# Patient Record
Sex: Male | Born: 1990 | Race: Asian | Hispanic: No | Marital: Single | Smoking: Never smoker
Health system: Southern US, Community
[De-identification: ages and names within clinical notes are randomized; demographics above are authoritative.]

## PROBLEM LIST (undated history)

## (undated) DIAGNOSIS — Z22322 Carrier or suspected carrier of Methicillin resistant Staphylococcus aureus: Secondary | ICD-10-CM

## (undated) HISTORY — DX: Carrier or suspected carrier of methicillin resistant Staphylococcus aureus: Z22.322

---

## 2013-12-14 ENCOUNTER — Ambulatory Visit (INDEPENDENT_AMBULATORY_CARE_PROVIDER_SITE_OTHER): Payer: BC Managed Care – PPO | Admitting: Family Medicine

## 2013-12-14 ENCOUNTER — Ambulatory Visit: Payer: BC Managed Care – PPO

## 2013-12-14 VITALS — BP 138/90 | HR 76 | Temp 98.2°F | Resp 18 | Ht 67.5 in | Wt 158.0 lb

## 2013-12-14 DIAGNOSIS — J029 Acute pharyngitis, unspecified: Secondary | ICD-10-CM

## 2013-12-14 DIAGNOSIS — M25519 Pain in unspecified shoulder: Secondary | ICD-10-CM

## 2013-12-14 DIAGNOSIS — J309 Allergic rhinitis, unspecified: Secondary | ICD-10-CM

## 2013-12-14 DIAGNOSIS — R0981 Nasal congestion: Secondary | ICD-10-CM

## 2013-12-14 DIAGNOSIS — J3489 Other specified disorders of nose and nasal sinuses: Secondary | ICD-10-CM

## 2013-12-14 LAB — POCT RAPID STREP A (OFFICE): Rapid Strep A Screen: NEGATIVE

## 2013-12-14 NOTE — Progress Notes (Signed)
Chief Complaint:  Chief Complaint  Patient presents with  . Sinusitis  . Shoulder Pain    left    HPI: Jose Lyons is a 23 y.o. male who is here for 2 day history of sore throat and sinus congestion. He has nasal discharge. Minimal cough. Has some sinus pressure. No cough, no fevers or chills. Deneis allergies.  No history of sinus issues, except during the winter. No fever chills. SOme ear discomfort. Some  Facial pain and HA. He took some iburpfen. Has not tried any allergy meds except cough drop.  Has had 1 year history of  left shoulder pain, had been boxing and thought it might have been related to that, thought it was impingement, has been doing rotator cuff exercises, he also plays ultimate frisbee and it hurtspretty bad and currently does not know what to do next . He has stopped boxing. Some weakness and , feels like there is a clikc when he extends. He Pain with bench press and push up exercises. Has been mostly next to shoulder blade and also in the front at bicep shoulder girdle intersection. Sometimes rapid movement causes pain. But in the past 3-4 months he feel smore dull achey pain, more so after exercise.  Some pain at night. Does not have pain when he sleeps on it. He has tried ibuprofen and roller.  With minimal relief.   History reviewed. No pertinent past medical history. History reviewed. No pertinent past surgical history. History   Social History  . Marital Status: Single    Spouse Name: N/A    Number of Children: N/A  . Years of Education: N/A   Social History Main Topics  . Smoking status: Never Smoker   . Smokeless tobacco: None  . Alcohol Use: Yes  . Drug Use: No  . Sexual Activity: None   Other Topics Concern  . None   Social History Narrative  . None   History reviewed. No pertinent family history. No Known Allergies Prior to Admission medications   Not on File     ROS: The patient denies fevers, chills, night sweats, unintentional  weight loss, chest pain, palpitations, wheezing, dyspnea on exertion, nausea, vomiting, abdominal pain, dysuria, hematuria, melena  All other systems have been reviewed and were otherwise negative with the exception of those mentioned in the HPI and as above.    PHYSICAL EXAM: Filed Vitals:   12/14/13 1609  BP: 138/90  Pulse: 76  Temp: 98.2 F (36.8 C)  Resp: 18   Filed Vitals:   12/14/13 1609  Height: 5' 7.5" (1.715 m)  Weight: 158 lb (71.668 kg)   Body mass index is 24.37 kg/(m^2).  General: Alert, no acute distress HEENT:  Normocephalic, atraumatic, oropharynx patent. EOMI, PERRLA, TM nl, minimally tender sinus, bogg nares with erythemtous turbinates, no exudates.  Cardiovascular:  Regular rate and rhythm, no rubs murmurs or gallops.  No Carotid bruits, radial pulse intact. No pedal edema.  Respiratory: Clear to auscultation bilaterally.  No wheezes, rales, or rhonchi.  No cyanosis, no use of accessory musculature GI: No organomegaly, abdomen is soft and non-tender, positive bowel sounds.  No masses. Skin: No rashes. Neurologic: Facial musculature symmetric. Psychiatric: Patient is appropriate throughout our interaction. Lymphatic: No cervical lymphadenopathy Musculoskeletal: Gait intact. Neck exam-normal, neg spurling Left shoulder no deformities, no swelling, no erythema, full ROM, 5/5 strength, he has a click that is palpable upon hyperflexionof the shouldergirdle, it is felt at the bicepstendon/shoulder girdle jxn  senstation intact, 2/2 DTRs Neg empty can, neg liftoff, neg Hawkins Mild disomfort with internal rotation but has to be hyper IR  LABS: Results for orders placed in visit on 12/14/13  POCT RAPID STREP A (OFFICE)      Result Value Ref Range   Rapid Strep A Screen Negative  Negative     EKG/XRAY:   [facility] Neg for fx/dislocation   ASSESSMENT/PLAN: Encounter Diagnoses  Name Primary?  . Sore throat   . Pain in  joint, shoulder region Yes  . Sinus congestion   . Allergic rhinitis    Refer to ortho for shoulder pain since ongoing for years, possible rotator cuff , labral issues OTC nasacort, zyrtec IF sxs do not improve with otc meds then consider abx or other treatment options F/u prn  Gross sideeffects, risk and benefits, and alternatives of medications d/w patient. Patient is aware that all medications have potential sideeffects and we are unable to predict every sideeffect or drug-drug interaction that may occur.  Lenell Antuhao P Corbin Hott, DO 12/14/2013 6:22 PM

## 2013-12-14 NOTE — Patient Instructions (Signed)
TAKE OTC NASOCORT nasal spray daily TAKE Zyrtec DAILY IF NO IMPROVEMENT THEN CALL ME IN 7 DAYS

## 2014-02-22 ENCOUNTER — Ambulatory Visit (INDEPENDENT_AMBULATORY_CARE_PROVIDER_SITE_OTHER): Payer: BC Managed Care – PPO | Admitting: Family Medicine

## 2014-02-22 VITALS — BP 130/70 | HR 60 | Temp 97.5°F | Resp 20 | Ht 67.0 in | Wt 156.6 lb

## 2014-02-22 DIAGNOSIS — Z113 Encounter for screening for infections with a predominantly sexual mode of transmission: Secondary | ICD-10-CM

## 2014-02-22 NOTE — Progress Notes (Signed)
   Subjective:    Patient ID: Jose RetortMichael Lyons, male    DOB: [birth year], 23 y.o.   MRN: [id number]  HPI Patient presents for STD testing. No current symptoms of STD such as penile discharge, itching, burning. Sexually active with one male partners, has had 3 partners in the last year. No condom use. No history of STDs. No concern that partner has STD. No fevers, chills, muscle aches.  Review of Systems  All other systems reviewed and are negative.     Objective:   Physical Exam  Constitutional: He is oriented to person, place, and time. He appears well-developed and well-nourished. No distress.  HENT:  Head: Normocephalic and atraumatic.  Eyes: Conjunctivae are normal. No scleral icterus.  Cardiovascular: Normal rate.   Pulmonary/Chest: Effort normal.  Musculoskeletal: Normal range of motion.  Neurological: He is alert and oriented to person, place, and time.  Skin: Skin is warm and dry.  Psychiatric: He has a normal mood and affect. His behavior is normal.      Assessment & Plan:  #1. STD screening - GC/CT, HIV, RPR - Counsel condom use for STD prevention - F/u prn

## 2014-02-22 NOTE — Patient Instructions (Signed)
Thank you for coming in today  1. We will call you with your lab results next week 2. Use condoms to prevent STD transmission 3. I recommend annual STD checks or more frequently with new partners  Followup as needed

## 2014-02-23 LAB — HIV ANTIBODY (ROUTINE TESTING W REFLEX): HIV 1&2 Ab, 4th Generation: NONREACTIVE

## 2014-02-23 LAB — RPR

## 2014-02-24 LAB — GC/CHLAMYDIA PROBE AMP
CT PROBE, AMP APTIMA: NEGATIVE
GC PROBE AMP APTIMA: NEGATIVE

## 2014-02-28 ENCOUNTER — Encounter: Payer: Self-pay | Admitting: *Deleted

## 2014-07-26 ENCOUNTER — Other Ambulatory Visit (HOSPITAL_COMMUNITY): Payer: Self-pay | Admitting: Family Medicine

## 2014-07-26 ENCOUNTER — Ambulatory Visit (HOSPITAL_COMMUNITY): Payer: BC Managed Care – PPO | Attending: Family Medicine

## 2014-07-26 DIAGNOSIS — R011 Cardiac murmur, unspecified: Secondary | ICD-10-CM

## 2014-07-26 NOTE — Progress Notes (Signed)
2D Echo completed. 07/26/2014 

## 2015-02-12 ENCOUNTER — Ambulatory Visit (INDEPENDENT_AMBULATORY_CARE_PROVIDER_SITE_OTHER): Payer: 59 | Admitting: Internal Medicine

## 2015-02-12 VITALS — BP 126/70 | HR 90 | Temp 97.9°F | Resp 16 | Ht 67.0 in | Wt 159.0 lb

## 2015-02-12 DIAGNOSIS — L03113 Cellulitis of right upper limb: Secondary | ICD-10-CM | POA: Diagnosis not present

## 2015-02-12 DIAGNOSIS — S40811A Abrasion of right upper arm, initial encounter: Secondary | ICD-10-CM | POA: Diagnosis not present

## 2015-02-12 MED ORDER — DOXYCYCLINE HYCLATE 100 MG PO TABS: 100.0000 mg | ORAL_TABLET | Freq: Two times a day (BID) | ORAL | Status: DC

## 2015-02-12 MED ORDER — MUPIROCIN 2 % EX OINT: 1.0000 "application " | TOPICAL_OINTMENT | Freq: Three times a day (TID) | CUTANEOUS | Status: DC

## 2015-02-12 NOTE — Patient Instructions (Signed)

## 2015-02-12 NOTE — Progress Notes (Signed)
   Subjective:    Patient ID: Jose RetortMichael Lyons, male    DOB: February 24, 1991, 24 y.o.   MRN: [id number]  HPI I, Trenda MootsMichelle Farrington R.T. (R), am scribing for Dr. Robert Bellowhris Elbridge Magowan M.D.   Jose NeedleMichael comes in today with right medial elbow pain. He states he was playing Ultimate Frisbee and "took a dive" x 3 days ago. He states it has gotten red and inflamed. He also states the wound has been draining and he has been using bacitracin.  Has healing abrasion right hip, has painful swollen oozing abrasion right forearm. States reddness is swReview of Systems  Constitutional: Negative.   Respiratory: Negative.   Musculoskeletal: Negative.   Skin: Positive for wound.        Objective:   Physical Exam  Constitutional: He appears well-developed and well-nourished.  Eyes: EOM are normal.  Pulmonary/Chest: Effort normal.  Skin: Abrasion noted. There is erythema.     Psychiatric: He has a normal mood and affect.          Assessment & Plan:  Cellulitis/abrasion Doxycycline/Bactroban

## 2015-02-26 ENCOUNTER — Encounter: Payer: Self-pay | Admitting: Nurse Practitioner

## 2015-02-26 ENCOUNTER — Ambulatory Visit (INDEPENDENT_AMBULATORY_CARE_PROVIDER_SITE_OTHER): Payer: 59 | Admitting: Nurse Practitioner

## 2015-02-26 VITALS — BP 128/88 | HR 64 | Temp 97.6°F | Resp 18 | Ht 68.5 in | Wt 155.8 lb

## 2015-02-26 DIAGNOSIS — Z23 Encounter for immunization: Secondary | ICD-10-CM | POA: Diagnosis not present

## 2015-02-26 DIAGNOSIS — Z0000 Encounter for general adult medical examination without abnormal findings: Secondary | ICD-10-CM

## 2015-02-26 DIAGNOSIS — J302 Other seasonal allergic rhinitis: Secondary | ICD-10-CM

## 2015-02-26 DIAGNOSIS — J301 Allergic rhinitis due to pollen: Secondary | ICD-10-CM | POA: Insufficient documentation

## 2015-02-26 MED ORDER — TETANUS-DIPHTH-ACELL PERTUSSIS 5-2.5-18.5 LF-MCG/0.5 IM SUSP: 0.5000 mL | Freq: Once | INTRAMUSCULAR | Status: DC

## 2015-02-26 NOTE — Patient Instructions (Signed)
Preventive Care for Adults A healthy lifestyle and preventive care can promote health and wellness. Preventive health guidelines for men include the following key practices:  A routine yearly physical is a good way to check with your health care provider about your health and preventative screening. It is a chance to share any concerns and updates on your health and to receive a thorough exam.  Visit your dentist for a routine exam and preventative care every 6 months. Brush your teeth twice a day and floss once a day. Good oral hygiene prevents tooth decay and gum disease.  The frequency of eye exams is based on your age, health, family medical history, use of contact lenses, and other factors. Follow your health care provider's recommendations for frequency of eye exams.  Eat a healthy diet. Foods such as vegetables, fruits, whole grains, low-fat dairy products, and lean protein foods contain the nutrients you need without too many calories. Decrease your intake of foods high in solid fats, added sugars, and salt. Eat the right amount of calories for you.Get information about a proper diet from your health care provider, if necessary.  Regular physical exercise is one of the most important things you can do for your health. Most adults should get at least 150 minutes of moderate-intensity exercise (any activity that increases your heart rate and causes you to sweat) each week. In addition, most adults need muscle-strengthening exercises on 2 or more days a week.  Maintain a healthy weight. The body mass index (BMI) is a screening tool to identify possible weight problems. It provides an estimate of body fat based on height and weight. Your health care provider can find your BMI and can help you achieve or maintain a healthy weight.For adults 20 years and older:  A BMI below 18.5 is considered underweight.  A BMI of 18.5 to 24.9 is normal.  A BMI of 25 to 29.9 is considered  overweight.  A BMI of 30 and above is considered obese.  Maintain normal blood lipids and cholesterol levels by exercising and minimizing your intake of saturated fat. Eat a balanced diet with plenty of fruit and vegetables. Blood tests for lipids and cholesterol should begin at age 24 and be repeated every 5 years. If your lipid or cholesterol levels are high, you are over 50, or you are at high risk for heart disease, you may need your cholesterol levels checked more frequently.Ongoing high lipid and cholesterol levels should be treated with medicines if diet and exercise are not working.  If you smoke, find out from your health care provider how to quit. If you do not use tobacco, do not start.  Lung cancer screening is recommended for adults aged 30-80 years who are at high risk for developing lung cancer because of a history of smoking. A yearly low-dose CT scan of the lungs is recommended for people who have at least a 30-pack-year history of smoking and are a current smoker or have quit within the past 15 years. A pack year of smoking is smoking an average of 1 pack of cigarettes a day for 1 year (for example: 1 pack a day for 30 years or 2 packs a day for 15 years). Yearly screening should continue until the smoker has stopped smoking for at least 15 years. Yearly screening should be stopped for people who develop a health problem that would prevent them from having lung cancer treatment.  If you choose to drink alcohol, do not  2 drinks per day. One drink is considered to be 12 ounces (355 mL) of beer, 5 ounces (148 mL) of wine, or 1.5 ounces (44 mL) of liquor.  Avoid use of street drugs. Do not share needles with anyone. Ask for help if you need support or instructions about stopping the use of drugs.  High blood pressure causes heart disease and increases the risk of stroke. Your blood pressure should be checked at least every 1-2 years. Ongoing high blood pressure should be treated with  medicines, if weight loss and exercise are not effective.  If you are 45-79 years old, ask your health care provider if you should take aspirin to prevent heart disease.  Diabetes screening involves taking a blood sample to check your fasting blood sugar level. This should be done once every 3 years, after age 45, if you are within normal weight and without risk factors for diabetes. Testing should be considered at a younger age or be carried out more frequently if you are overweight and have at least 1 risk factor for diabetes.  Colorectal cancer can be detected and often prevented. Most routine colorectal cancer screening begins at the age of 50 and continues through age 75. However, your health care provider may recommend screening at an earlier age if you have risk factors for colon cancer. On a yearly basis, your health care provider may provide home test kits to check for hidden blood in the stool. Use of a small camera at the end of a tube to directly examine the colon (sigmoidoscopy or colonoscopy) can detect the earliest forms of colorectal cancer. Talk to your health care provider about this at age 50, when routine screening begins. Direct exam of the colon should be repeated every 5-10 years through age 75, unless early forms of precancerous polyps or small growths are found.  People who are at an increased risk for hepatitis B should be screened for this virus. You are considered at high risk for hepatitis B if:  You were born in a country where hepatitis B occurs often. Talk with your health care provider about which countries are considered high risk.  Your parents were born in a high-risk country and you have not received a shot to protect against hepatitis B (hepatitis B vaccine).  You have HIV or AIDS.  You use needles to inject street drugs.  You live with, or have sex with, someone who has hepatitis B.  You are a man who has sex with other men (MSM).  You get hemodialysis  treatment.  You take certain medicines for conditions such as cancer, organ transplantation, and autoimmune conditions.  Hepatitis C blood testing is recommended for all people born from 1945 through 1965 and any individual with known risks for hepatitis C.  Practice safe sex. Use condoms and avoid high-risk sexual practices to reduce the spread of sexually transmitted infections (STIs). STIs include gonorrhea, chlamydia, syphilis, trichomonas, herpes, HPV, and human immunodeficiency virus (HIV). Herpes, HIV, and HPV are viral illnesses that have no cure. They can result in disability, cancer, and death.  If you are at risk of being infected with HIV, it is recommended that you take a prescription medicine daily to prevent HIV infection. This is called preexposure prophylaxis (PrEP). You are considered at risk if:  You are a man who has sex with other men (MSM) and have other risk factors.  You are a heterosexual man, are sexually active, and are at increased risk for HIV infection.    You take drugs by injection.  You are sexually active with a partner who has HIV.  Talk with your health care provider about whether you are at high risk of being infected with HIV. If you choose to begin PrEP, you should first be tested for HIV. You should then be tested every 3 months for as long as you are taking PrEP.  A one-time screening for abdominal aortic aneurysm (AAA) and surgical repair of large AAAs by ultrasound are recommended for men ages 65 to 75 years who are current or former smokers.  Healthy men should no longer receive prostate-specific antigen (PSA) blood tests as part of routine cancer screening. Talk with your health care provider about prostate cancer screening.  Testicular cancer screening is not recommended for adult males who have no symptoms. Screening includes self-exam, a health care provider exam, and other screening tests. Consult with your health care provider about any symptoms  you have or any concerns you have about testicular cancer.  Use sunscreen. Apply sunscreen liberally and repeatedly throughout the day. You should seek shade when your shadow is shorter than you. Protect yourself by wearing long sleeves, pants, a wide-brimmed hat, and sunglasses year round, whenever you are outdoors.  Once a month, do a whole-body skin exam, using a mirror to look at the skin on your back. Tell your health care provider about new moles, moles that have irregular borders, moles that are larger than a pencil eraser, or moles that have changed in shape or color.  Stay current with required vaccines (immunizations).  Influenza vaccine. All adults should be immunized every year.  Tetanus, diphtheria, and acellular pertussis (Td, Tdap) vaccine. An adult who has not previously received Tdap or who does not know his vaccine status should receive 1 dose of Tdap. This initial dose should be followed by tetanus and diphtheria toxoids (Td) booster doses every 10 years. Adults with an unknown or incomplete history of completing a 3-dose immunization series with Td-containing vaccines should begin or complete a primary immunization series including a Tdap dose. Adults should receive a Td booster every 10 years.  Varicella vaccine. An adult without evidence of immunity to varicella should receive 2 doses or a second dose if he has previously received 1 dose.  Human papillomavirus (HPV) vaccine. Males aged 13-21 years who have not received the vaccine previously should receive the 3-dose series. Males aged 22-26 years may be immunized. Immunization is recommended through the age of 26 years for any male who has sex with males and did not get any or all doses earlier. Immunization is recommended for any person with an immunocompromised condition through the age of 26 years if he did not get any or all doses earlier. During the 3-dose series, the second dose should be obtained 4-8 weeks after the first  dose. The third dose should be obtained 24 weeks after the first dose and 16 weeks after the second dose.  Zoster vaccine. One dose is recommended for adults aged 60 years or older unless certain conditions are present.  Measles, mumps, and rubella (MMR) vaccine. Adults born before 1957 generally are considered immune to measles and mumps. Adults born in 1957 or later should have 1 or more doses of MMR vaccine unless there is a contraindication to the vaccine or there is laboratory evidence of immunity to each of the three diseases. A routine second dose of MMR vaccine should be obtained at least 28 days after the first dose for students attending postsecondary   for students attending postsecondary schools, health care workers, or international travelers. People who received inactivated measles vaccine or an unknown type of measles vaccine during 1963-1967 should receive 2 doses of MMR vaccine. People who received inactivated mumps vaccine or an unknown type of mumps vaccine before 1979 and are at high risk for mumps infection should consider immunization with 2 doses of MMR vaccine. Unvaccinated health care workers born before [birth year] who lack laboratory evidence of measles, mumps, or rubella immunity or laboratory confirmation of disease should consider measles and mumps immunization with 2 doses of MMR vaccine or rubella immunization with 1 dose of MMR vaccine.  Pneumococcal 13-valent conjugate (PCV13) vaccine. When indicated, a person who is uncertain of his immunization history and has no record of immunization should receive the PCV13 vaccine. An adult aged 58 years or older who has certain medical conditions and has not been previously immunized should receive 1 dose of PCV13 vaccine. This PCV13 should be followed with a dose of pneumococcal polysaccharide (PPSV23) vaccine. The PPSV23 vaccine dose should be obtained at least 8 weeks after the dose of PCV13 vaccine. An adult aged 50 years or older who has certain medical  conditions and previously received 1 or more doses of PPSV23 vaccine should receive 1 dose of PCV13. The PCV13 vaccine dose should be obtained 1 or more years after the last PPSV23 vaccine dose.  Pneumococcal polysaccharide (PPSV23) vaccine. When PCV13 is also indicated, PCV13 should be obtained first. All adults aged 69 years and older should be immunized. An adult younger than age 80 years who has certain medical conditions should be immunized. Any person who resides in a nursing home or long-term care facility should be immunized. An adult smoker should be immunized. People with an immunocompromised condition and certain other conditions should receive both PCV13 and PPSV23 vaccines. People with human immunodeficiency virus (HIV) infection should be immunized as soon as possible after diagnosis. Immunization during chemotherapy or radiation therapy should be avoided. Routine use of PPSV23 vaccine is not recommended for American Indians, [place] Natives, or people younger than 65 years unless there are medical conditions that require PPSV23 vaccine. When indicated, people who have unknown immunization and have no record of immunization should receive PPSV23 vaccine. One-time revaccination 5 years after the first dose of PPSV23 is recommended for people aged 19-64 years who have chronic kidney failure, nephrotic syndrome, asplenia, or immunocompromised conditions. People who received 1-2 doses of PPSV23 before age 61 years should receive another dose of PPSV23 vaccine at age 30 years or later if at least 5 years have passed since the previous dose. Doses of PPSV23 are not needed for people immunized with PPSV23 at or after age 24 years.  Meningococcal vaccine. Adults with asplenia or persistent complement component deficiencies should receive 2 doses of quadrivalent meningococcal conjugate (MenACWY-D) vaccine. The doses should be obtained at least 2 months apart. Microbiologists working with certain  meningococcal bacteria, [employer] recruits, people at risk during an outbreak, and people who travel to or live in countries with a high rate of meningitis should be immunized. A first-year college student up through age 92 years who is living in a residence hall should receive a dose if he did not receive a dose on or after his 16th birthday. Adults who have certain high-risk conditions should receive one or more doses of vaccine.  Hepatitis A vaccine. Adults who wish to be protected from this disease, have certain high-risk conditions, work with hepatitis A-infected animals, work in hepatitis  A research labs, or travel to or work in countries with a high rate of hepatitis A should be immunized. Adults who were previously unvaccinated and who anticipate close contact with an international adoptee during the first 60 days after arrival in the [place] from a country with a high rate of hepatitis A should be immunized.  Hepatitis B vaccine. Adults should be immunized if they wish to be protected from this disease, have certain high-risk conditions, may be exposed to blood or other infectious body fluids, are household contacts or sex partners of hepatitis B positive people, are clients or workers in certain care facilities, or travel to or work in countries with a high rate of hepatitis B.  Haemophilus influenzae type b (Hib) vaccine. A previously unvaccinated person with asplenia or sickle cell disease or having a scheduled splenectomy should receive 1 dose of Hib vaccine. Regardless of previous immunization, a recipient of a hematopoietic stem cell transplant should receive a 3-dose series 6-12 months after his successful transplant. Hib vaccine is not recommended for adults with HIV infection. Preventive Service / Frequency Ages 44 to 105  Blood pressure check.** / Every 1 to 2 years.  Lipid and cholesterol check.** / Every 5 years beginning at age 84.  Hepatitis C blood test.** / For any  individual with known risks for hepatitis C.  Skin self-exam. / Monthly.  Influenza vaccine. / Every year.  Tetanus, diphtheria, and acellular pertussis (Tdap, Td) vaccine.** / Consult your health care provider. 1 dose of Td every 10 years.  Varicella vaccine.** / Consult your health care provider.  HPV vaccine. / 3 doses over 6 months, if 71 or younger.  Measles, mumps, rubella (MMR) vaccine.** / You need at least 1 dose of MMR if you were born in 1957 or later. You may also need a second dose.  Pneumococcal 13-valent conjugate (PCV13) vaccine.** / Consult your health care provider.  Pneumococcal polysaccharide (PPSV23) vaccine.** / 1 to 2 doses if you smoke cigarettes or if you have certain conditions.  Meningococcal vaccine.** / 1 dose if you are age 59 to 45 years and a Market researcher living in a residence hall, or have one of several medical conditions. You may also need additional booster doses.  Hepatitis A vaccine.** / Consult your health care provider.  Hepatitis B vaccine.** / Consult your health care provider.  Haemophilus influenzae type b (Hib) vaccine.** / Consult your health care provider. Ages 27 to 19  Blood pressure check.** / Every 1 to 2 years.  Lipid and cholesterol check.** / Every 5 years beginning at age 25.  Lung cancer screening. / Every year if you are aged 52-80 years and have a 30-pack-year history of smoking and currently smoke or have quit within the past 15 years. Yearly screening is stopped once you have quit smoking for at least 15 years or develop a health problem that would prevent you from having lung cancer treatment.  Fecal occult blood test (FOBT) of stool. / Every year beginning at age 58 and continuing until age 67. You may not have to do this test if you get a colonoscopy every 10 years.  Flexible sigmoidoscopy** or colonoscopy.** / Every 5 years for a flexible sigmoidoscopy or every 10 years for a colonoscopy beginning at age  103 and continuing until age 87.  Hepatitis C blood test.** / For all people born from [birth year] through 1965 and any individual with known risks for hepatitis C.  Skin self-exam. / Monthly.  Influenza vaccine. / Every year.  Tetanus, diphtheria, and acellular pertussis (Tdap/Td) vaccine.** / Consult your health care provider. 1 dose of Td every 10 years.  Varicella vaccine.** / Consult your health care provider.  Zoster vaccine.** / 1 dose for adults aged 71 years or older.  Measles, mumps, rubella (MMR) vaccine.** / You need at least 1 dose of MMR if you were born in 1957 or later. You may also need a second dose.  Pneumococcal 13-valent conjugate (PCV13) vaccine.** / Consult your health care provider.  Pneumococcal polysaccharide (PPSV23) vaccine.** / 1 to 2 doses if you smoke cigarettes or if you have certain conditions.  Meningococcal vaccine.** / Consult your health care provider.  Hepatitis A vaccine.** / Consult your health care provider.  Hepatitis B vaccine.** / Consult your health care provider.  Haemophilus influenzae type b (Hib) vaccine.** / Consult your health care provider. Ages 86 and over  Blood pressure check.** / Every 1 to 2 years.  Lipid and cholesterol check.**/ Every 5 years beginning at age 76.  Lung cancer screening. / Every year if you are aged 60-80 years and have a 30-pack-year history of smoking and currently smoke or have quit within the past 15 years. Yearly screening is stopped once you have quit smoking for at least 15 years or develop a health problem that would prevent you from having lung cancer treatment.  Fecal occult blood test (FOBT) of stool. / Every year beginning at age 109 and continuing until age 37. You may not have to do this test if you get a colonoscopy every 10 years.  Flexible sigmoidoscopy** or colonoscopy.** / Every 5 years for a flexible sigmoidoscopy or every 10 years for a colonoscopy beginning at age 70 and continuing until age  48.  Hepatitis C blood test.** / For all people born from [birth year] through 1965 and any individual with known risks for hepatitis C.  Abdominal aortic aneurysm (AAA) screening.** / A one-time screening for ages 89 to 41 years who are current or former smokers.  Skin self-exam. / Monthly.  Influenza vaccine. / Every year.  Tetanus, diphtheria, and acellular pertussis (Tdap/Td) vaccine.** / 1 dose of Td every 10 years.  Varicella vaccine.** / Consult your health care provider.  Zoster vaccine.** / 1 dose for adults aged 90 years or older.  Pneumococcal 13-valent conjugate (PCV13) vaccine.** / Consult your health care provider.  Pneumococcal polysaccharide (PPSV23) vaccine.** / 1 dose for all adults aged 53 years and older.  Meningococcal vaccine.** / Consult your health care provider.  Hepatitis A vaccine.** / Consult your health care provider.  Hepatitis B vaccine.** / Consult your health care provider.  Haemophilus influenzae type b (Hib) vaccine.** / Consult your health care provider. **Family history and personal history of risk and conditions may change your health care provider's recommendations. Document Released: 10/20/2001 Document Revised: 08/29/2013 Document Reviewed: 01/19/2011 [facility] Patient Information 2015 [place], [place]. This information is not intended to replace advice given to you by your health care provider. Make sure you discuss any questions you have with your health care provider.

## 2015-02-26 NOTE — Progress Notes (Signed)
Patient ID: Jose Lyons, male   DOB: [birth year], 24 y.o.   MRN: [id number]    PCP: No PCP Per Patient  No Known Allergies  Chief Complaint  Patient presents with  . Establish Care    Establishe care     HPI: Patient is a 24 y.o. male seen in the office today to establish care. No complaints today. Changed insurance and needed a new primary care.  Hx of seasonal allergies, shoulder pain Saw orthopedist- unsure of name- no intervention done.   Vaccines Up to date on:  influenza,  Need: Tdap, Rx given  Smoking status: none Alcohol use: none  Dentist: has not had routine cleaning but has appt set up Ophthalmologist: does not go, no changes in vision  Exercise regimen: 5 days a week, 1 hour Lifts weights Diet: tries to eat enough carbs, protein and vegetables   Had cholesterol checked last year and it was fine  Review of Systems:  Review of Systems  Constitutional: Negative for activity change, appetite change, fatigue and unexpected weight change.  HENT: Negative for congestion and hearing loss.   Eyes: Negative.   Respiratory: Negative for cough and shortness of breath.   Cardiovascular: Negative for chest pain, palpitations and leg swelling.  Gastrointestinal: Negative for abdominal pain, diarrhea and constipation.  Genitourinary: Negative for dysuria and difficulty urinating.  Musculoskeletal: Negative for myalgias and arthralgias.       Hx of shoulder pain, left  Skin: Negative for color change and wound.       Hx of MRSA  Allergic/Immunologic: Positive for environmental allergies.  Neurological: Negative for dizziness, weakness, light-headedness and headaches.  Psychiatric/Behavioral: Negative for behavioral problems, confusion and agitation.    Past Medical History  Diagnosis Date  . MRSA (methicillin resistant staph aureus) culture positive    History reviewed. No pertinent past surgical history. Social History:   reports that he has never smoked. He has  never used smokeless tobacco. He reports that he does not drink alcohol or use illicit drugs.  Family History  Problem Relation Age of Onset  . Diabetes Paternal Grandmother     Medications: Patient's Medications  New Prescriptions   No medications on file  Previous Medications   CETIRIZINE (ZYRTEC) 10 MG TABLET    Take 10 mg by mouth daily.   OMEGA-3 FATTY ACIDS (FISH OIL) 1200 MG CAPS    Take by mouth. Take 1 tablet by mouth daily  Modified Medications   No medications on file  Discontinued Medications   DOXYCYCLINE (VIBRA-TABS) 100 MG TABLET    Take 1 tablet (100 mg total) by mouth 2 (two) times daily.   MUPIROCIN OINTMENT (BACTROBAN) 2 %    Apply 1 application topically 3 (three) times daily.     Physical Exam:  Filed Vitals:   02/26/15 0838  BP: 128/88  Pulse: 64  Temp: 97.6 F (36.4 C)  TempSrc: Oral  Resp: 18  Height: 5' 8.5" (1.74 m)  Weight: 155 lb 12.8 oz (70.67 kg)  SpO2: 97%    Physical Exam  Constitutional: He is oriented to person, place, and time. He appears well-developed and well-nourished. No distress.  HENT:  Head: Normocephalic and atraumatic.  Mouth/Throat: Oropharynx is clear and moist. No oropharyngeal exudate.  Eyes: Conjunctivae and EOM are normal. Pupils are equal, round, and reactive to light.  Neck: Normal range of motion. Neck supple.  Cardiovascular: Normal rate, regular rhythm and normal heart sounds.   Pulmonary/Chest: Effort normal and breath sounds normal.  Abdominal: Soft. Bowel sounds are normal. He exhibits no distension.  Musculoskeletal: Normal range of motion. He exhibits no edema or tenderness.  Neurological: He is alert and oriented to person, place, and time.  Skin: Skin is warm and dry. He is not diaphoretic.  Psychiatric: He has a normal mood and affect.    Labs reviewed: Basic Metabolic Panel: No results for input(s): NA, K, CL, CO2, GLUCOSE, BUN, CREATININE, CALCIUM, MG, PHOS, TSH in the last 8760 hours. Liver  Function Tests: No results for input(s): AST, ALT, ALKPHOS, BILITOT, PROT, ALBUMIN in the last 8760 hours. No results for input(s): LIPASE, AMYLASE in the last 8760 hours. No results for input(s): AMMONIA in the last 8760 hours. CBC: No results for input(s): WBC, NEUTROABS, HGB, HCT, MCV, PLT in the last 8760 hours. Lipid Panel: No results for input(s): CHOL, HDL, LDLCALC, TRIG, CHOLHDL, LDLDIRECT in the last 8760 hours. TSH: No results for input(s): TSH in the last 8760 hours. A1C: No results found for: HGBA1C   Assessment/Plan 1. Seasonal allergies -currently on zyrtec 10 mg which has not been as effective recently, plans to change to Claritin 10 mg to see if this is more effective   2. Preventative health care The patient is doing well and no distinct problems were identified on exam. The patient was counseled regarding the appropriate use of alcohol, regular self-examination of the breasts on a monthly basis, prevention of dental and periodontal disease, diet, regular sustained exercise for at least 30 minutes 5 times per week, testicular self-examination on a monthly basis,smoking cessation, tobacco use,  and recommended schedule for cholesterol, thyroid and diabetes screening. - Lipid panel; Future - Comprehensive metabolic panel; Future - CBC With Differential; Future   Davonne Baby K. Biagio Borg  [facility] [phone] 8 am - 5 pm) [phone] (after hours)

## 2016-02-24 ENCOUNTER — Other Ambulatory Visit: Payer: 59

## 2016-02-24 DIAGNOSIS — Z0000 Encounter for general adult medical examination without abnormal findings: Secondary | ICD-10-CM

## 2016-02-25 LAB — CBC WITH DIFFERENTIAL/PLATELET
BASOS ABS: 0 10*3/uL (ref 0.0–0.2)
BASOS: 0 %
EOS (ABSOLUTE): 0.6 10*3/uL — AB (ref 0.0–0.4)
Eos: 12 %
HEMOGLOBIN: 15 g/dL (ref 12.6–17.7)
Hematocrit: 45.1 % (ref 37.5–51.0)
Immature Grans (Abs): 0.1 10*3/uL (ref 0.0–0.1)
Immature Granulocytes: 1 %
LYMPHS ABS: 1.4 10*3/uL (ref 0.7–3.1)
Lymphs: 31 %
MCH: 29.3 pg (ref 26.6–33.0)
MCHC: 33.3 g/dL (ref 31.5–35.7)
MCV: 88 fL (ref 79–97)
Monocytes Absolute: 0.3 10*3/uL (ref 0.1–0.9)
Monocytes: 7 %
NEUTROS ABS: 2.3 10*3/uL (ref 1.4–7.0)
Neutrophils: 49 %
PLATELETS: 211 10*3/uL (ref 150–379)
RBC: 5.12 x10E6/uL (ref 4.14–5.80)
RDW: 13.1 % (ref 12.3–15.4)
WBC: 4.6 10*3/uL (ref 3.4–10.8)

## 2016-02-25 LAB — LIPID PANEL
CHOL/HDL RATIO: 3 ratio (ref 0.0–5.0)
Cholesterol, Total: 167 mg/dL (ref 100–199)
HDL: 55 mg/dL (ref >39)
LDL CALC: 88 mg/dL (ref 0–99)
TRIGLYCERIDES: 119 mg/dL (ref 0–149)
VLDL Cholesterol Cal: 24 mg/dL (ref 5–40)

## 2016-02-25 LAB — COMPREHENSIVE METABOLIC PANEL
ALT: 36 IU/L (ref 0–44)
AST: 24 IU/L (ref 0–40)
Albumin/Globulin Ratio: 1.7 (ref 1.2–2.2)
Albumin: 4.7 g/dL (ref 3.5–5.5)
Alkaline Phosphatase: 69 IU/L (ref 39–117)
BILIRUBIN TOTAL: 0.4 mg/dL (ref 0.0–1.2)
BUN/Creatinine Ratio: 12 (ref 9–20)
BUN: 12 mg/dL (ref 6–20)
CHLORIDE: 100 mmol/L (ref 96–106)
CO2: 26 mmol/L (ref 18–29)
Calcium: 9.5 mg/dL (ref 8.7–10.2)
Creatinine, Ser: 1.04 mg/dL (ref 0.76–1.27)
GFR calc non Af Amer: 99 mL/min/{1.73_m2} (ref >59)
GFR, EST AFRICAN AMERICAN: 114 mL/min/{1.73_m2} (ref >59)
GLOBULIN, TOTAL: 2.7 g/dL (ref 1.5–4.5)
Glucose: 94 mg/dL (ref 65–99)
POTASSIUM: 4.2 mmol/L (ref 3.5–5.2)
SODIUM: 138 mmol/L (ref 134–144)
Total Protein: 7.4 g/dL (ref 6.0–8.5)

## 2016-02-27 ENCOUNTER — Encounter: Payer: Self-pay | Admitting: Nurse Practitioner

## 2016-02-27 ENCOUNTER — Ambulatory Visit: Payer: 59 | Admitting: Nurse Practitioner

## 2016-03-12 ENCOUNTER — Encounter: Payer: Self-pay | Admitting: Nurse Practitioner

## 2016-03-12 ENCOUNTER — Ambulatory Visit (INDEPENDENT_AMBULATORY_CARE_PROVIDER_SITE_OTHER): Payer: 59 | Admitting: Nurse Practitioner

## 2016-03-12 VITALS — BP 128/82 | HR 64 | Temp 97.6°F | Resp 17 | Ht 69.0 in | Wt 156.6 lb

## 2016-03-12 DIAGNOSIS — Z23 Encounter for immunization: Secondary | ICD-10-CM

## 2016-03-12 DIAGNOSIS — Z0000 Encounter for general adult medical examination without abnormal findings: Secondary | ICD-10-CM

## 2016-03-12 NOTE — Patient Instructions (Signed)
Follow up in 1 year with Dr Eulas Post for Physical   Preventive Care for Adults, Male A healthy lifestyle and preventive care can promote health and wellness. Preventive health guidelines for men include the following key practices:  A routine yearly physical is a good way to check with your health care provider about your health and preventative screening. It is a chance to share any concerns and updates on your health and to receive a thorough exam.  Visit your dentist for a routine exam and preventative care every 6 months. Brush your teeth twice a day and floss once a day. Good oral hygiene prevents tooth decay and gum disease.  The frequency of eye exams is based on your age, health, family medical history, use of contact lenses, and other factors. Follow your health care provider's recommendations for frequency of eye exams.  Eat a healthy diet. Foods such as vegetables, fruits, whole grains, low-fat dairy products, and lean protein foods contain the nutrients you need without too many calories. Decrease your intake of foods high in solid fats, added sugars, and salt. Eat the right amount of calories for you.Get information about a proper diet from your health care provider, if necessary.  Regular physical exercise is one of the most important things you can do for your health. Most adults should get at least 150 minutes of moderate-intensity exercise (any activity that increases your heart rate and causes you to sweat) each week. In addition, most adults need muscle-strengthening exercises on 2 or more days a week.  Maintain a healthy weight. The body mass index (BMI) is a screening tool to identify possible weight problems. It provides an estimate of body fat based on height and weight. Your health care provider can find your BMI and can help you achieve or maintain a healthy weight.For adults 20 years and older:  A BMI below 18.5 is considered underweight.  A BMI of 18.5 to 24.9 is  normal.  A BMI of 25 to 29.9 is considered overweight.  A BMI of 30 and above is considered obese.  Maintain normal blood lipids and cholesterol levels by exercising and minimizing your intake of saturated fat. Eat a balanced diet with plenty of fruit and vegetables. Blood tests for lipids and cholesterol should begin at age 13 and be repeated every 5 years. If your lipid or cholesterol levels are high, you are over 50, or you are at high risk for heart disease, you may need your cholesterol levels checked more frequently.Ongoing high lipid and cholesterol levels should be treated with medicines if diet and exercise are not working.  If you smoke, find out from your health care provider how to quit. If you do not use tobacco, do not start.  Lung cancer screening is recommended for adults aged 52-80 years who are at high risk for developing lung cancer because of a history of smoking. A yearly low-dose CT scan of the lungs is recommended for people who have at least a 30-pack-year history of smoking and are a current smoker or have quit within the past 15 years. A pack year of smoking is smoking an average of 1 pack of cigarettes a day for 1 year (for example: 1 pack a day for 30 years or 2 packs a day for 15 years). Yearly screening should continue until the smoker has stopped smoking for at least 15 years. Yearly screening should be stopped for people who develop a health problem that would prevent them from having lung cancer  treatment.  If you choose to drink alcohol, do not have more than 2 drinks per day. One drink is considered to be 12 ounces (355 mL) of beer, 5 ounces (148 mL) of wine, or 1.5 ounces (44 mL) of liquor.  Avoid use of street drugs. Do not share needles with anyone. Ask for help if you need support or instructions about stopping the use of drugs.  High blood pressure causes heart disease and increases the risk of stroke. Your blood pressure should be checked at least every 1-2  years. Ongoing high blood pressure should be treated with medicines, if weight loss and exercise are not effective.  If you are 54-86 years old, ask your health care provider if you should take aspirin to prevent heart disease.  Diabetes screening is done by taking a blood sample to check your blood glucose level after you have not eaten for a certain period of time (fasting). If you are not overweight and you do not have risk factors for diabetes, you should be screened once every 3 years starting at age 78. If you are overweight or obese and you are 59-38 years of age, you should be screened for diabetes every year as part of your cardiovascular risk assessment.  Colorectal cancer can be detected and often prevented. Most routine colorectal cancer screening begins at the age of 28 and continues through age 68. However, your health care provider may recommend screening at an earlier age if you have risk factors for colon cancer. On a yearly basis, your health care provider may provide home test kits to check for hidden blood in the stool. Use of a small camera at the end of a tube to directly examine the colon (sigmoidoscopy or colonoscopy) can detect the earliest forms of colorectal cancer. Talk to your health care provider about this at age 14, when routine screening begins. Direct exam of the colon should be repeated every 5-10 years through age 82, unless early forms of precancerous polyps or small growths are found.  People who are at an increased risk for hepatitis B should be screened for this virus. You are considered at high risk for hepatitis B if:  You were born in a country where hepatitis B occurs often. Talk with your health care provider about which countries are considered high risk.  Your parents were born in a high-risk country and you have not received a shot to protect against hepatitis B (hepatitis B vaccine).  You have HIV or AIDS.  You use needles to inject street  drugs.  You live with, or have sex with, someone who has hepatitis B.  You are a man who has sex with other men (MSM).  You get hemodialysis treatment.  You take certain medicines for conditions such as cancer, organ transplantation, and autoimmune conditions.  Hepatitis C blood testing is recommended for all people born from [birth year] through 1965 and any individual with known risks for hepatitis C.  Practice safe sex. Use condoms and avoid high-risk sexual practices to reduce the spread of sexually transmitted infections (STIs). STIs include gonorrhea, chlamydia, syphilis, trichomonas, herpes, HPV, and human immunodeficiency virus (HIV). Herpes, HIV, and HPV are viral illnesses that have no cure. They can result in disability, cancer, and death.  If you are a man who has sex with other men, you should be screened at least once per year for:  HIV.  Urethral, rectal, and pharyngeal infection of gonorrhea, chlamydia, or both.  If you are at risk  of being infected with HIV, it is recommended that you take a prescription medicine daily to prevent HIV infection. This is called preexposure prophylaxis (PrEP). You are considered at risk if:  You are a man who has sex with other men (MSM) and have other risk factors.  You are a heterosexual man, are sexually active, and are at increased risk for HIV infection.  You take drugs by injection.  You are sexually active with a partner who has HIV.  Talk with your health care provider about whether you are at high risk of being infected with HIV. If you choose to begin PrEP, you should first be tested for HIV. You should then be tested every 3 months for as long as you are taking PrEP.  A one-time screening for abdominal aortic aneurysm (AAA) and surgical repair of large AAAs by ultrasound are recommended for men ages 65 to 63 years who are current or former smokers.  Healthy men should no longer receive prostate-specific antigen (PSA) blood tests as  part of routine cancer screening. Talk with your health care provider about prostate cancer screening.  Testicular cancer screening is not recommended for adult males who have no symptoms. Screening includes self-exam, a health care provider exam, and other screening tests. Consult with your health care provider about any symptoms you have or any concerns you have about testicular cancer.  Use sunscreen. Apply sunscreen liberally and repeatedly throughout the day. You should seek shade when your shadow is shorter than you. Protect yourself by wearing long sleeves, pants, a wide-brimmed hat, and sunglasses year round, whenever you are outdoors.  Once a month, do a whole-body skin exam, using a mirror to look at the skin on your back. Tell your health care provider about new moles, moles that have irregular borders, moles that are larger than a pencil eraser, or moles that have changed in shape or color.  Stay current with required vaccines (immunizations).  Influenza vaccine. All adults should be immunized every year.  Tetanus, diphtheria, and acellular pertussis (Td, Tdap) vaccine. An adult who has not previously received Tdap or who does not know his vaccine status should receive 1 dose of Tdap. This initial dose should be followed by tetanus and diphtheria toxoids (Td) booster doses every 10 years. Adults with an unknown or incomplete history of completing a 3-dose immunization series with Td-containing vaccines should begin or complete a primary immunization series including a Tdap dose. Adults should receive a Td booster every 10 years.  Varicella vaccine. An adult without evidence of immunity to varicella should receive 2 doses or a second dose if he has previously received 1 dose.  Human papillomavirus (HPV) vaccine. Males aged 11-21 years who have not received the vaccine previously should receive the 3-dose series. Males aged 22-26 years may be immunized. Immunization is recommended through  the age of 82 years for any male who has sex with males and did not get any or all doses earlier. Immunization is recommended for any person with an immunocompromised condition through the age of 69 years if he did not get any or all doses earlier. During the 3-dose series, the second dose should be obtained 4-8 weeks after the first dose. The third dose should be obtained 24 weeks after the first dose and 16 weeks after the second dose.  Zoster vaccine. One dose is recommended for adults aged 9 years or older unless certain conditions are present.  Measles, mumps, and rubella (MMR) vaccine. Adults born before [birth year]  generally are considered immune to measles and mumps. Adults born in [birth year] or later should have 1 or more doses of MMR vaccine unless there is a contraindication to the vaccine or there is laboratory evidence of immunity to each of the three diseases. A routine second dose of MMR vaccine should be obtained at least 28 days after the first dose for students attending postsecondary schools, health care workers, or international travelers. People who received inactivated measles vaccine or an unknown type of measles vaccine during 1963-1967 should receive 2 doses of MMR vaccine. People who received inactivated mumps vaccine or an unknown type of mumps vaccine before 1979 and are at high risk for mumps infection should consider immunization with 2 doses of MMR vaccine. Unvaccinated health care workers born before [birth year] who lack laboratory evidence of measles, mumps, or rubella immunity or laboratory confirmation of disease should consider measles and mumps immunization with 2 doses of MMR vaccine or rubella immunization with 1 dose of MMR vaccine.  Pneumococcal 13-valent conjugate (PCV13) vaccine. When indicated, a person who is uncertain of his immunization history and has no record of immunization should receive the PCV13 vaccine. All adults 67 years of age and older should receive this vaccine. An  adult aged 71 years or older who has certain medical conditions and has not been previously immunized should receive 1 dose of PCV13 vaccine. This PCV13 should be followed with a dose of pneumococcal polysaccharide (PPSV23) vaccine. Adults who are at high risk for pneumococcal disease should obtain the PPSV23 vaccine at least 8 weeks after the dose of PCV13 vaccine. Adults older than 27 years of age who have normal immune system function should obtain the PPSV23 vaccine dose at least 1 year after the dose of PCV13 vaccine.  Pneumococcal polysaccharide (PPSV23) vaccine. When PCV13 is also indicated, PCV13 should be obtained first. All adults aged 52 years and older should be immunized. An adult younger than age 60 years who has certain medical conditions should be immunized. Any person who resides in a nursing home or long-term care facility should be immunized. An adult smoker should be immunized. People with an immunocompromised condition and certain other conditions should receive both PCV13 and PPSV23 vaccines. People with human immunodeficiency virus (HIV) infection should be immunized as soon as possible after diagnosis. Immunization during chemotherapy or radiation therapy should be avoided. Routine use of PPSV23 vaccine is not recommended for American Indians, [place]atives, or people younger than 65 years unless there are medical conditions that require PPSV23 vaccine. When indicated, people who have unknown immunization and have no record of immunization should receive PPSV23 vaccine. One-time revaccination 5 years after the first dose of PPSV23 is recommended for people aged 19-64 years who have chronic kidney failure, nephrotic syndrome, asplenia, or immunocompromised conditions. People who received 1-2 doses of PPSV23 before age 67 years should receive another dose of PPSV23 vaccine at age 46 years or later if at least 5 years have passed since the previous dose. Doses of PPSV23 are not needed for  people immunized with PPSV23 at or after age 44 years.  Meningococcal vaccine. Adults with asplenia or persistent complement component deficiencies should receive 2 doses of quadrivalent meningococcal conjugate (MenACWY-D) vaccine. The doses should be obtained at least 2 months apart. Microbiologists working with certain meningococcal bacteria, [employer] recruits, people at risk during an outbreak, and people who travel to or live in countries with a high rate of meningitis should be immunized. A first-year college student up through age  21 years who is living in a residence hall should receive a dose if he did not receive a dose on or after his 16th birthday. Adults who have certain high-risk conditions should receive one or more doses of vaccine.  Hepatitis A vaccine. Adults who wish to be protected from this disease, have chronic liver disease, work with hepatitis A-infected animals, work in hepatitis A research labs, or travel to or work in countries with a high rate of hepatitis A should be immunized. Adults who were previously unvaccinated and who anticipate close contact with an international adoptee during the first 60 days after arrival in the [place] from a country with a high rate of hepatitis A should be immunized.  Hepatitis B vaccine. Adults should be immunized if they wish to be protected from this disease, are under age 26 years and have diabetes, have chronic liver disease, have had more than one sex partner in the past 6 months, may be exposed to blood or other infectious body fluids, are household contacts or sex partners of hepatitis B positive people, are clients or workers in certain care facilities, or travel to or work in countries with a high rate of hepatitis B.  Haemophilus influenzae type b (Hib) vaccine. A previously unvaccinated person with asplenia or sickle cell disease or having a scheduled splenectomy should receive 1 dose of Hib vaccine. Regardless of previous  immunization, a recipient of a hematopoietic stem cell transplant should receive a 3-dose series 6-12 months after his successful transplant. Hib vaccine is not recommended for adults with HIV infection. Preventive Service / Frequency Ages 17 to 64  Blood pressure check.** / Every 3-5 years.  Lipid and cholesterol check.** / Every 5 years beginning at age 91.  Hepatitis C blood test.** / For any individual with known risks for hepatitis C.  Skin self-exam. / Monthly.  Influenza vaccine. / Every year.  Tetanus, diphtheria, and acellular pertussis (Tdap, Td) vaccine.** / Consult your health care provider. 1 dose of Td every 10 years.  Varicella vaccine.** / Consult your health care provider.  HPV vaccine. / 3 doses over 6 months, if 49 or younger.  Measles, mumps, rubella (MMR) vaccine.** / You need at least 1 dose of MMR if you were born in 1957 or later. You may also need a second dose.  Pneumococcal 13-valent conjugate (PCV13) vaccine.** / Consult your health care provider.  Pneumococcal polysaccharide (PPSV23) vaccine.** / 1 to 2 doses if you smoke cigarettes or if you have certain conditions.  Meningococcal vaccine.** / 1 dose if you are age 41 to 62 years and a Market researcher living in a residence hall, or have one of several medical conditions. You may also need additional booster doses.  Hepatitis A vaccine.** / Consult your health care provider.  Hepatitis B vaccine.** / Consult your health care provider.  Haemophilus influenzae type b (Hib) vaccine.** / Consult your health care provider. Ages 10 to 23  Blood pressure check.** / Every year.  Lipid and cholesterol check.** / Every 5 years beginning at age 30.  Lung cancer screening. / Every year if you are aged 71-80 years and have a 30-pack-year history of smoking and currently smoke or have quit within the past 15 years. Yearly screening is stopped once you have quit smoking for at least 15 years or develop  a health problem that would prevent you from having lung cancer treatment.  Fecal occult blood test (FOBT) of stool. / Every year beginning at age  37 and continuing until age 59. You may not have to do this test if you get a colonoscopy every 10 years.  Flexible sigmoidoscopy** or colonoscopy.** / Every 5 years for a flexible sigmoidoscopy or every 10 years for a colonoscopy beginning at age 60 and continuing until age 25.  Hepatitis C blood test.** / For all people born from [birth year] through 1965 and any individual with known risks for hepatitis C.  Skin self-exam. / Monthly.  Influenza vaccine. / Every year.  Tetanus, diphtheria, and acellular pertussis (Tdap/Td) vaccine.** / Consult your health care provider. 1 dose of Td every 10 years.  Varicella vaccine.** / Consult your health care provider.  Zoster vaccine.** / 1 dose for adults aged 64 years or older.  Measles, mumps, rubella (MMR) vaccine.** / You need at least 1 dose of MMR if you were born in 1959 or later. You may also need a second dose.  Pneumococcal 13-valent conjugate (PCV13) vaccine.** / Consult your health care provider.  Pneumococcal polysaccharide (PPSV23) vaccine.** / 1 to 2 doses if you smoke cigarettes or if you have certain conditions.  Meningococcal vaccine.** / Consult your health care provider.  Hepatitis A vaccine.** / Consult your health care provider.  Hepatitis B vaccine.** / Consult your health care provider.  Haemophilus influenzae type b (Hib) vaccine.** / Consult your health care provider. Ages 53 and over  Blood pressure check.** / Every year.  Lipid and cholesterol check.**/ Every 5 years beginning at age 66.  Lung cancer screening. / Every year if you are aged 46-80 years and have a 30-pack-year history of smoking and currently smoke or have quit within the past 15 years. Yearly screening is stopped once you have quit smoking for at least 15 years or develop a health problem that would prevent  you from having lung cancer treatment.  Fecal occult blood test (FOBT) of stool. / Every year beginning at age 72 and continuing until age 2. You may not have to do this test if you get a colonoscopy every 10 years.  Flexible sigmoidoscopy** or colonoscopy.** / Every 5 years for a flexible sigmoidoscopy or every 10 years for a colonoscopy beginning at age 61 and continuing until age 43.  Hepatitis C blood test.** / For all people born from [birth year] through 1967 and any individual with known risks for hepatitis C.  Abdominal aortic aneurysm (AAA) screening.** / A one-time screening for ages 31 to 37 years who are current or former smokers.  Skin self-exam. / Monthly.  Influenza vaccine. / Every year.  Tetanus, diphtheria, and acellular pertussis (Tdap/Td) vaccine.** / 1 dose of Td every 10 years.  Varicella vaccine.** / Consult your health care provider.  Zoster vaccine.** / 1 dose for adults aged 53 years or older.  Pneumococcal 13-valent conjugate (PCV13) vaccine.** / 1 dose for all adults aged 43 years and older.  Pneumococcal polysaccharide (PPSV23) vaccine.** / 1 dose for all adults aged 64 years and older.  Meningococcal vaccine.** / Consult your health care provider.  Hepatitis A vaccine.** / Consult your health care provider.  Hepatitis B vaccine.** / Consult your health care provider.  Haemophilus influenzae type b (Hib) vaccine.** / Consult your health care provider. **Family history and personal history of risk and conditions may change your health care provider's recommendations.   This information is not intended to replace advice given to you by your health care provider. Make sure you discuss any questions you have with your health care provider.   Document Released:  10/20/2001 Document Revised: 09/14/2014 Document Reviewed: 01/19/2011 Elsevier Interactive Patient Education [employer].

## 2016-03-12 NOTE — Progress Notes (Signed)
Patient ID: Jose Lyons, male   DOB: [birth year], 25 y.o.   MRN: [id number]    PCP: Sharon SellerEUBANKS, Khrystal Jeanmarie K, NP  Advanced Directive information Does patient have an advance directive?: No, Would patient like information on creating an advanced directive?: No - patient declined information  No Known Allergies  Chief Complaint  Patient presents with  . Medical Management of Chronic Issues    Complete Physical: labs printed     HPI: Patient is a 25 y.o. male seen in the office today for yearly exam. There has been no major illnesses or hospitalization in the last year.   [facility] 03/12/2016 02/12/2015  Decreased Interest 0 0  Down, Depressed, Hopeless 0 0  PHQ - 2 Score 0 0   Falls Fall Risk  03/12/2016 02/26/2015  Falls in the past year? No No   Vaccines  There is no immunization history on file for this patient. Does not get flu shot routinely   Smoking status:.none Alcohol use: socially  Dentist: every 6 months Ophthalmologist: does not go to the eye doctor Screening eye exam revealed right eye 20/20, left 20/40, both eyes 20/25  Exercise regimen: ultimate frisbee and weight lift.  Diet: eating what he wants, attempts heart healthy  Review of Systems:  Review of Systems  Constitutional: Negative for activity change, appetite change, fatigue and unexpected weight change.  HENT: Negative for congestion and hearing loss.   Eyes: Negative.   Respiratory: Negative for cough and shortness of breath.   Cardiovascular: Negative for chest pain, palpitations and leg swelling.  Gastrointestinal: Negative for abdominal pain, diarrhea and constipation.  Genitourinary: Negative for dysuria and difficulty urinating.  Musculoskeletal: Negative for myalgias and arthralgias.  Skin: Negative for color change and wound.  Allergic/Immunologic: Positive for environmental allergies.  Neurological: Negative for dizziness and weakness.  Psychiatric/Behavioral: Negative for  behavioral problems, confusion and agitation.     Past Medical History  Diagnosis Date  . MRSA (methicillin resistant staph aureus) culture positive    History reviewed. No pertinent past surgical history. Social History:   reports that he has never smoked. He has never used smokeless tobacco. He reports that he does not drink alcohol or use illicit drugs.  Family History  Problem Relation Age of Onset  . Diabetes Paternal Grandmother     Medications: Patient's Medications  New Prescriptions   No medications on file  Previous Medications   FEXOFENADINE (ALLEGRA) 180 MG TABLET    Take 180 mg by mouth daily.   OMEGA-3 FATTY ACIDS (FISH OIL) 1200 MG CAPS    Take by mouth. Take 1 tablet by mouth daily   TRIAMCINOLONE CREAM (KENALOG) 0.5 %    APP A THIN  LAYER AA AS DIRECTED  Modified Medications   No medications on file  Discontinued Medications   CETIRIZINE (ZYRTEC) 10 MG TABLET    Take 10 mg by mouth daily.   TDAP (BOOSTRIX) 5-2.5-18.5 LF-MCG/0.5 INJECTION    Inject 0.5 mLs into the muscle once.     Physical Exam:  Filed Vitals:   03/12/16 1051  BP: 128/82  Pulse: 64  Temp: 97.6 F (36.4 C)  TempSrc: Oral  Resp: 17  Height: 5\' 9"  (1.753 m)  Weight: 156 lb 9.6 oz (71.033 kg)  SpO2: 92%   Body mass index is 23.12 kg/(m^2).  Physical Exam  Constitutional: He is oriented to person, place, and time. He appears well-developed and well-nourished. No distress.  HENT:  Head: Normocephalic and atraumatic.  Right  Ear: External ear normal.  Left Ear: External ear normal.  Nose: Mucosal edema and rhinorrhea present.  Mouth/Throat: Oropharynx is clear and moist. No oropharyngeal exudate.  Eyes: Conjunctivae and EOM are normal. Pupils are equal, round, and reactive to light.  Neck: Normal range of motion. Neck supple.  Cardiovascular: Normal rate, regular rhythm and normal heart sounds.   Pulmonary/Chest: Effort normal and breath sounds normal.  Abdominal: Soft. Bowel  sounds are normal. He exhibits no distension.  Musculoskeletal: Normal range of motion. He exhibits no edema or tenderness.  Neurological: He is alert and oriented to person, place, and time.  Skin: Skin is warm and dry. He is not diaphoretic.  Psychiatric: He has a normal mood and affect.   Labs reviewed: Basic Metabolic Panel:  Recent Labs  69/62/9506/19/17 0811  NA 138  K 4.2  CL 100  CO2 26  GLUCOSE 94  BUN 12  CREATININE 1.04  CALCIUM 9.5   Liver Function Tests:  Recent Labs  02/24/16 0811  AST 24  ALT 36  ALKPHOS 69  BILITOT 0.4  PROT 7.4  ALBUMIN 4.7   No results for input(s): LIPASE, AMYLASE in the last 8760 hours. No results for input(s): AMMONIA in the last 8760 hours. CBC:  Recent Labs  02/24/16 1206  WBC 4.6  NEUTROABS 2.3  HCT 45.1  MCV 88  PLT 211   Lipid Panel:  Recent Labs  02/24/16 0811  CHOL 167  HDL 55  LDLCALC 88  TRIG 119  CHOLHDL 3.0   TSH: No results for input(s): TSH in the last 8760 hours. A1C: No results found for: HGBA1C   Assessment/Plan  Preventative health care The patient is doing well, labs reviewed with pt.  The patient was counseled regarding the appropriate use of alcohol, regular self-examination of the breasts on a monthly basis, prevention of dental and periodontal disease, diet, regular sustained exercise for at least 30 minutes 5 times per week, testicular self-examination on a monthly basis,smoking cessation, tobacco use,  and recommended schedule for GI hemoccult testing, colonoscopy, cholesterol, thyroid and diabetes screening.   Need for Tdap vaccination - Tdap vaccine greater than or equal to 7yo IM  Follow up in 1 year for annual exam with Dr Corie Chiquitoarter   Raechel Marcos K. Biagio BorgEubanks, AGNP  [facility] [phone](Monday-Friday 8 am - 5 pm) [phone] (after hours)

## 2016-10-20 ENCOUNTER — Ambulatory Visit (INDEPENDENT_AMBULATORY_CARE_PROVIDER_SITE_OTHER): Payer: BLUE CROSS/BLUE SHIELD

## 2016-10-20 DIAGNOSIS — Z23 Encounter for immunization: Secondary | ICD-10-CM | POA: Diagnosis not present

## 2017-03-17 ENCOUNTER — Ambulatory Visit (INDEPENDENT_AMBULATORY_CARE_PROVIDER_SITE_OTHER): Payer: BLUE CROSS/BLUE SHIELD | Admitting: Internal Medicine

## 2017-03-17 ENCOUNTER — Encounter: Payer: Self-pay | Admitting: Internal Medicine

## 2017-03-17 VITALS — BP 118/70 | HR 58 | Temp 98.0°F | Ht 68.11 in | Wt 155.6 lb

## 2017-03-17 DIAGNOSIS — L209 Atopic dermatitis, unspecified: Secondary | ICD-10-CM | POA: Insufficient documentation

## 2017-03-17 DIAGNOSIS — Z0000 Encounter for general adult medical examination without abnormal findings: Secondary | ICD-10-CM

## 2017-03-17 DIAGNOSIS — J301 Allergic rhinitis due to pollen: Secondary | ICD-10-CM

## 2017-03-17 DIAGNOSIS — B351 Tinea unguium: Secondary | ICD-10-CM | POA: Diagnosis not present

## 2017-03-17 LAB — CBC WITH DIFFERENTIAL/PLATELET
BASOS ABS: 0 {cells}/uL (ref 0–200)
Basophils Relative: 0 %
EOS ABS: 392 {cells}/uL (ref 15–500)
EOS PCT: 8 %
HCT: 44.6 % (ref 38.5–50.0)
HEMOGLOBIN: 14.7 g/dL (ref 13.2–17.1)
Lymphocytes Relative: 29 %
Lymphs Abs: 1421 cells/uL (ref 850–3900)
MCH: 28.8 pg (ref 27.0–33.0)
MCHC: 33 g/dL (ref 32.0–36.0)
MCV: 87.3 fL (ref 80.0–100.0)
MONOS PCT: 5 %
MPV: 10.2 fL (ref 7.5–12.5)
Monocytes Absolute: 245 cells/uL (ref 200–950)
NEUTROS PCT: 58 %
Neutro Abs: 2842 cells/uL (ref 1500–7800)
Platelets: 230 10*3/uL (ref 140–400)
RBC: 5.11 MIL/uL (ref 4.20–5.80)
RDW: 13.5 % (ref 11.0–15.0)
WBC: 4.9 10*3/uL (ref 3.8–10.8)

## 2017-03-17 LAB — COMPLETE METABOLIC PANEL WITH GFR
ALBUMIN: 4.6 g/dL (ref 3.6–5.1)
ALK PHOS: 59 U/L (ref 40–115)
ALT: 18 U/L (ref 9–46)
AST: 22 U/L (ref 10–40)
BUN: 18 mg/dL (ref 7–25)
CO2: 23 mmol/L (ref 20–31)
Calcium: 9.3 mg/dL (ref 8.6–10.3)
Chloride: 104 mmol/L (ref 98–110)
Creat: 0.98 mg/dL (ref 0.60–1.35)
GLUCOSE: 78 mg/dL (ref 65–99)
POTASSIUM: 3.9 mmol/L (ref 3.5–5.3)
SODIUM: 140 mmol/L (ref 135–146)
TOTAL PROTEIN: 7.3 g/dL (ref 6.1–8.1)
Total Bilirubin: 0.8 mg/dL (ref 0.2–1.2)

## 2017-03-17 LAB — LIPID PANEL
CHOLESTEROL: 140 mg/dL (ref <200)
HDL: 48 mg/dL (ref >40)
LDL Cholesterol: 79 mg/dL (ref <100)
TRIGLYCERIDES: 67 mg/dL (ref <150)
Total CHOL/HDL Ratio: 2.9 Ratio (ref <5.0)
VLDL: 13 mg/dL (ref <30)

## 2017-03-17 MED ORDER — TERBINAFINE HCL 250 MG PO TABS: 250.0000 mg | ORAL_TABLET | Freq: Every day | ORAL | 3 refills | Status: DC

## 2017-03-17 NOTE — Patient Instructions (Signed)
Take terbinafine daily x 12 weeks  Follow up in 1 yr for CPE with fasting labs prior to appt  Keeping you healthy  Get these tests  Blood pressure- Have your blood pressure checked once a year by your healthcare provider.  Normal blood pressure is 120/80.  Weight- Have your body mass index (BMI) calculated to screen for obesity.  BMI is a measure of body fat based on height and weight. You can also calculate your own BMI at https://www.west-esparza.com/www.nhlbisupport.com/bmi/.  Cholesterol- Have your cholesterol checked regularly starting at age 28, sooner may be necessary if you have diabetes, high blood pressure, if a family member developed heart diseases at an early age or if you smoke.   Chlamydia, HIV, and other sexual transmitted disease- Get screened each year until the age of 28 then within three months of each new sexual partner.  Diabetes- Have your blood sugar checked regularly if you have high blood pressure, high cholesterol, a family history of diabetes or if you are overweight.  Get these vaccines  Flu shot- Every fall.  Tetanus shot- Every 10 years.  Menactra- Single dose; prevents meningitis.  Take these steps  Don't smoke- If you do smoke, ask your healthcare provider about quitting. For tips on how to quit, go to www.smokefree.gov or call 1-800-QUIT-NOW.  Be physically active- Exercise 5 days a week for at least 30 minutes.  If you are not already physically active start slow and gradually work up to 30 minutes of moderate physical activity.  Examples of moderate activity include walking briskly, mowing the yard, dancing, swimming bicycling, etc.  Eat a healthy diet- Eat a variety of healthy foods such as fruits, vegetables, low fat milk, low fat cheese, yogurt, lean meats, poultry, fish, beans, tofu, etc.  For more information on healthy eating, go to www.thenutritionsource.org  Drink alcohol in moderation- Limit alcohol intake two drinks or less a day.  Never drink and drive.  Dentist-  Brush and floss teeth twice daily; visit your dentis twice a year.  Depression-Your emotional health is as important as your physical health.  If you're feeling down, losing interest in things you normally enjoy please talk with your healthcare provider.  Gun Safety- If you keep a gun in your home, keep it unloaded and with the safety lock on.  Bullets should be stored separately.  Helmet use- Always wear a helmet when riding a motorcycle, bicycle, rollerblading or skateboarding.  Safe sex- If you may be exposed to a sexually transmitted infection, use a condom  Seat belts- Seat bels can save your life; always wear one.  Smoke/Carbon Monoxide detectors- These detectors need to be installed on the appropriate level of your home.  Replace batteries at least once a year.  Skin Cancer- When out in the sun, cover up and use sunscreen SPF 15 or higher.  Violence- If anyone is threatening or hurting you, please tell your healthcare provider.

## 2017-03-17 NOTE — Progress Notes (Signed)
Patient ID: Jose Lyons, male   DOB: [birth year], 26 y.o.   MRN: [id number]   Location:  [facility]  Place of Service:  OFFICE  Provider: Arletha Grippe, DO  Patient Care Team: Lauree Chandler, NP as PCP - General (Geriatric Medicine)  Extended Emergency Contact Information Primary Emergency Contact: [facility] Address: [place] [address] [place] Phone: [phone] Relation: Mother Secondary Emergency Contact: Harris,Parker Address: [address]          [place], [place] [address] [place]nette Litter of [employer] Phone: [phone] Relation: Friend  Code Status: FULL CODE Goals of Care: Advanced Directive information Advanced Directives 03/17/2017  Does Patient Have a Medical Advance Directive? No  Would patient like information on creating a medical advance directive? No - Patient declined     Chief Complaint  Patient presents with  . Annual Exam    Yearly Exam    HPI: Patient is a 26 y.o. male seen in today for an annual wellness exam. He has seasonal allergy that is worse in the Spring. He uses nasal steroid at that time. He does use daily antihistamine.   DIET - he makes healthy food choices and avoids sweet drinks. He consumes 2-4 cups of caffienated coffee daily (16 oz/serving)  EXERCISE - plays sports. He has left 4th toenail injury that has progressed to thick and yellowing x several mos. Ultimate frisbee and weight lifting  DENTIST - has hygiene appt every 6 mos  SMOKING - NONE  SEATBELTS - wears consistently  Depression screen [facility] 2/9 03/17/2017 03/12/2016 02/12/2015  Decreased Interest 0 0 0  Down, Depressed, Hopeless 0 0 0  PHQ - 2 Score 0 0 0    Fall Risk  03/17/2017 03/12/2016 02/26/2015  Falls in the past year? No No No   No flowsheet data found.   Health Maintenance  Topic Date Due  . INFLUENZA VACCINE  04/07/2017  . TETANUS/TDAP  03/12/2026  . HIV Screening  Completed    Past Medical History:  Diagnosis  Date  . MRSA (methicillin resistant staph aureus) culture positive     History reviewed. No pertinent surgical history.  Family History  Problem Relation Age of Onset  . Diabetes Paternal Grandmother    Family Status  Relation Status  . Mother Alive  . Father Alive  . PGM Deceased    Social History   Social History  . Marital status: Single    Spouse name: N/A  . Number of children: N/A  . Years of education: N/A   Occupational History  . Not on file.   Social History Main Topics  . Smoking status: Never Smoker  . Smokeless tobacco: Never Used  . Alcohol use No  . Drug use: No  . Sexual activity: Not on file   Other Topics Concern  . Not on file   Social History Narrative   Diet:      Do you drink/ eat things with caffeine? coffee      Marital status:                               What year were you married ?      Do you live in a house, apartment,assistred living, condo, trailer, etc.)? apartment      Is it one or more stories? 2      How many  persons live in your home ? 1      Do you have any pets in your home ?(please list) no      Current or past profession: Engineer       Do you exercise?  Yes                            Type & how often: 5 x a week      Do you have a living will?No      Do you have a DNR form?    No                   If not, do you want to discuss one? Yes      Do you have signed POA?HPOA forms?                 If so, please bring to your        appointment          No Known Allergies  Allergies as of 03/17/2017   No Known Allergies     Medication List       Accurate as of 03/17/17 10:39 AM. Always use your most recent med list.          fexofenadine 180 MG tablet Commonly known as:  ALLEGRA Take 180 mg by mouth daily.   Fish Oil 1200 MG Caps Take by mouth. Take 1 tablet by mouth daily   triamcinolone cream 0.5 % Commonly known as:  KENALOG APP A THIN  LAYER AA AS DIRECTED        Review of Systems:    Review of Systems  Constitutional: Negative for activity change, chills and fatigue.  HENT: Negative for sore throat and trouble swallowing.   Eyes: Negative for visual disturbance.  Respiratory: Negative for cough, chest tightness and shortness of breath.   Cardiovascular: Negative for chest pain, palpitations and leg swelling.  Gastrointestinal: Negative for abdominal pain, blood in stool, nausea and vomiting.  Genitourinary: Negative for difficulty urinating, frequency and urgency.  Musculoskeletal: Negative for arthralgias and gait problem.  Skin: Negative for rash.  Neurological: Negative for weakness and headaches.  Psychiatric/Behavioral: Negative for confusion and sleep disturbance. The patient is not nervous/anxious.     Physical Exam: Vitals:   03/17/17 1035  BP: 118/70  Pulse: (!) 58  Temp: 98 F (36.7 C)  TempSrc: Oral  SpO2: 98%  Weight: 155 lb 9.6 oz (70.6 kg)  Height: 5' 8.11" (1.73 m)   Body mass index is 23.58 kg/m. Physical Exam  Constitutional: He is oriented to person, place, and time. He appears well-developed and well-nourished. No distress.  HENT:  Head: Normocephalic and atraumatic.  Right Ear: External ear normal.  Left Ear: External ear normal.  Mouth/Throat: Oropharynx is clear and moist. No oropharyngeal exudate.  MMM; no oral thrush  Eyes: EOM are normal. Pupils are equal, round, and reactive to light. No scleral icterus.  Neck: Normal range of motion. Neck supple. Carotid bruit is not present. No tracheal deviation present. No thyromegaly present.  Cardiovascular: Normal rate, regular rhythm and intact distal pulses.  Exam reveals no gallop and no friction rub.   No murmur heard. No LE edema b/l. No calf TTP  Pulmonary/Chest: Effort normal and breath sounds normal. No respiratory distress. He has no wheezes. He has no rales. He exhibits no tenderness.  No rhonchi  Abdominal: Soft. Bowel sounds are  normal. He exhibits no distension and no  mass. There is no hepatosplenomegaly or hepatomegaly. There is no tenderness. There is no rebound and no guarding. No hernia. Hernia confirmed negative in the right inguinal area and confirmed negative in the left inguinal area.  Genitourinary: Testes normal and penis normal. Right testis shows no mass, no swelling and no tenderness. Left testis shows no mass, no swelling and no tenderness. Circumcised. No phimosis, paraphimosis, penile erythema or penile tenderness. No discharge found.  Genitourinary Comments: Chaperone present  Musculoskeletal: He exhibits no edema or deformity.  Lymphadenopathy:    He has no cervical adenopathy.       Right: No inguinal adenopathy present.       Left: No inguinal adenopathy present.  Neurological: He is alert and oriented to person, place, and time. He has normal reflexes.  Skin: Skin is warm and dry. Rash (eczematous  patchy rash posterior base of neck) noted.  B/l 1st toenail and left 4th toenail thick, discolored but intact; nailbed on left 4th toe dark appearing; b/l callus plantar medial 1st toe but no ulceration  Psychiatric: He has a normal mood and affect. His behavior is normal. Judgment and thought content normal.  Vitals reviewed.   Labs reviewed:  Basic Metabolic Panel: No results for input(s): NA, K, CL, CO2, GLUCOSE, BUN, CREATININE, CALCIUM, MG, PHOS, TSH in the last 8760 hours. Liver Function Tests: No results for input(s): AST, ALT, ALKPHOS, BILITOT, PROT, ALBUMIN in the last 8760 hours. No results for input(s): LIPASE, AMYLASE in the last 8760 hours. No results for input(s): AMMONIA in the last 8760 hours. CBC: No results for input(s): WBC, NEUTROABS, HGB, HCT, MCV, PLT in the last 8760 hours. Lipid Panel: No results for input(s): CHOL, HDL, LDLCALC, TRIG, CHOLHDL, LDLDIRECT in the last 8760 hours. No results found for: HGBA1C  Procedures: No results found.  Assessment/Plan   ICD-10-CM   1. Well adult exam Z00.00 CBC with  Differential/Platelet    CMP with eGFR    Lipid Panel  2. Onychomycosis of toenail B35.1 terbinafine (LAMISIL) 250 MG tablet  3. Atopic dermatitis, unspecified type L20.9   4. Seasonal allergic rhinitis due to pollen J30.1     Take terbinafine daily x 12 weeks. May need repeat hepatic function in 1 month  Follow up in 1 yr for CPE with fasting labs prior to appt  Keeping you healthy handout given   Cordella Register. Perlie Gold  [facility] [address] [place], [place] [address] [phone] Cell (Monday-Friday 8 AM - 5 PM) [phone] After 5 PM and follow prompts

## 2017-04-14 ENCOUNTER — Other Ambulatory Visit: Payer: Self-pay

## 2017-04-14 ENCOUNTER — Encounter: Payer: Self-pay | Admitting: Internal Medicine

## 2017-04-14 DIAGNOSIS — B351 Tinea unguium: Secondary | ICD-10-CM

## 2017-04-14 MED ORDER — TERBINAFINE HCL 250 MG PO TABS: 250.0000 mg | ORAL_TABLET | Freq: Every day | ORAL | 0 refills | Status: DC

## 2017-04-14 NOTE — Telephone Encounter (Signed)
Patient sent a request via mychart requesting a refill of lamisil for the remaining 8 weeks of treatment. Rx was sent for # 60 with no RF.

## 2017-05-12 ENCOUNTER — Other Ambulatory Visit: Payer: Self-pay | Admitting: Internal Medicine

## 2017-05-12 DIAGNOSIS — B351 Tinea unguium: Secondary | ICD-10-CM

## 2017-05-13 MED ORDER — TERBINAFINE HCL 250 MG PO TABS: 250.0000 mg | ORAL_TABLET | Freq: Every day | ORAL | 0 refills | Status: DC

## 2017-09-23 ENCOUNTER — Ambulatory Visit (INDEPENDENT_AMBULATORY_CARE_PROVIDER_SITE_OTHER): Payer: BLUE CROSS/BLUE SHIELD | Admitting: *Deleted

## 2017-09-23 DIAGNOSIS — Z23 Encounter for immunization: Secondary | ICD-10-CM | POA: Diagnosis not present

## 2018-03-14 ENCOUNTER — Other Ambulatory Visit: Payer: Self-pay | Admitting: Internal Medicine

## 2018-03-14 DIAGNOSIS — Z0000 Encounter for general adult medical examination without abnormal findings: Secondary | ICD-10-CM

## 2018-03-18 ENCOUNTER — Other Ambulatory Visit: Payer: BLUE CROSS/BLUE SHIELD

## 2018-03-18 DIAGNOSIS — Z0000 Encounter for general adult medical examination without abnormal findings: Secondary | ICD-10-CM

## 2018-03-18 LAB — COMPLETE METABOLIC PANEL WITH GFR
AG RATIO: 1.6 (calc) (ref 1.0–2.5)
ALBUMIN MSPROF: 4.3 g/dL (ref 3.6–5.1)
ALKALINE PHOSPHATASE (APISO): 68 U/L (ref 40–115)
ALT: 32 U/L (ref 9–46)
AST: 24 U/L (ref 10–40)
BILIRUBIN TOTAL: 0.4 mg/dL (ref 0.2–1.2)
BUN: 17 mg/dL (ref 7–25)
CHLORIDE: 106 mmol/L (ref 98–110)
CO2: 28 mmol/L (ref 20–32)
Calcium: 9.1 mg/dL (ref 8.6–10.3)
Creat: 1.01 mg/dL (ref 0.60–1.35)
GFR, Est African American: 117 mL/min/{1.73_m2} (ref >=60)
GFR, Est Non African American: 101 mL/min/{1.73_m2} (ref >=60)
GLUCOSE: 90 mg/dL (ref 65–99)
Globulin: 2.7 g/dL (calc) (ref 1.9–3.7)
POTASSIUM: 4.2 mmol/L (ref 3.5–5.3)
Sodium: 140 mmol/L (ref 135–146)
Total Protein: 7 g/dL (ref 6.1–8.1)

## 2018-03-18 LAB — CBC WITH DIFFERENTIAL/PLATELET
BASOS PCT: 0.6 %
Basophils Absolute: 30 cells/uL (ref 0–200)
EOS PCT: 6.5 %
Eosinophils Absolute: 325 cells/uL (ref 15–500)
HEMATOCRIT: 43.1 % (ref 38.5–50.0)
Hemoglobin: 14.5 g/dL (ref 13.2–17.1)
LYMPHS ABS: 1665 {cells}/uL (ref 850–3900)
MCH: 28.3 pg (ref 27.0–33.0)
MCHC: 33.6 g/dL (ref 32.0–36.0)
MCV: 84 fL (ref 80.0–100.0)
MPV: 10.7 fL (ref 7.5–12.5)
Monocytes Relative: 6.5 %
Neutro Abs: 2655 cells/uL (ref 1500–7800)
Neutrophils Relative %: 53.1 %
Platelets: 251 10*3/uL (ref 140–400)
RBC: 5.13 10*6/uL (ref 4.20–5.80)
RDW: 12.4 % (ref 11.0–15.0)
Total Lymphocyte: 33.3 %
WBC: 5 10*3/uL (ref 3.8–10.8)
WBCMIX: 325 {cells}/uL (ref 200–950)

## 2018-03-18 LAB — LIPID PANEL
Cholesterol: 106 mg/dL (ref <200)
HDL: 32 mg/dL — AB (ref >40)
LDL CHOLESTEROL (CALC): 54 mg/dL
NON-HDL CHOLESTEROL (CALC): 74 mg/dL (ref <130)
TRIGLYCERIDES: 116 mg/dL (ref <150)
Total CHOL/HDL Ratio: 3.3 (calc) (ref <5.0)

## 2018-03-22 ENCOUNTER — Encounter: Payer: Self-pay | Admitting: Internal Medicine

## 2018-03-22 ENCOUNTER — Ambulatory Visit (INDEPENDENT_AMBULATORY_CARE_PROVIDER_SITE_OTHER): Payer: BLUE CROSS/BLUE SHIELD | Admitting: Internal Medicine

## 2018-03-22 VITALS — BP 128/82 | HR 76 | Temp 97.9°F | Resp 18 | Ht 68.0 in | Wt 152.6 lb

## 2018-03-22 DIAGNOSIS — J301 Allergic rhinitis due to pollen: Secondary | ICD-10-CM | POA: Diagnosis not present

## 2018-03-22 DIAGNOSIS — Z0000 Encounter for general adult medical examination without abnormal findings: Secondary | ICD-10-CM

## 2018-03-22 DIAGNOSIS — E786 Lipoprotein deficiency: Secondary | ICD-10-CM | POA: Diagnosis not present

## 2018-03-22 DIAGNOSIS — R030 Elevated blood-pressure reading, without diagnosis of hypertension: Secondary | ICD-10-CM | POA: Diagnosis not present

## 2018-03-22 NOTE — Patient Instructions (Addendum)
Continue current medications as ordered  May use triamcinolone cream for insect bites  Follow up in 1 yr for CPE. Fasting labs prior to appt  Keeping you healthy   Get these tests  Blood pressure- Have your blood pressure checked once a year by your healthcare provider.  Normal blood pressure is 120/80.  Weight- Have your body mass index (BMI) calculated to screen for obesity.  BMI is a measure of body fat based on height and weight. You can also calculate your own BMI at https://www.west-esparza.com/www.nhlbisupport.com/bmi/.  Cholesterol- Have your cholesterol checked regularly starting at age 29, sooner may be necessary if you have diabetes, high blood pressure, if a family member developed heart diseases at an early age or if you smoke.   Chlamydia, HIV, and other sexual transmitted disease- Get screened each year until the age of 29 then within three months of each new sexual partner.  Diabetes- Have your blood sugar checked regularly if you have high blood pressure, high cholesterol, a family history of diabetes or if you are overweight.  Get these vaccines  Flu shot- Every fall.  Tetanus shot- Every 10 years.  Menactra- Single dose; prevents meningitis.  Take these steps  Don't smoke- If you do smoke, ask your healthcare provider about quitting. For tips on how to quit, go to www.smokefree.gov or call 1-800-QUIT-NOW.  Be physically active- Exercise 5 days a week for at least 30 minutes.  If you are not already physically active start slow and gradually work up to 30 minutes of moderate physical activity.  Examples of moderate activity include walking briskly, mowing the yard, dancing, swimming bicycling, etc.  Eat a healthy diet- Eat a variety of healthy foods such as fruits, vegetables, low fat milk, low fat cheese, yogurt, lean meats, poultry, fish, beans, tofu, etc.  For more information on healthy eating, go to www.thenutritionsource.org  Drink alcohol in moderation- Limit alcohol intake two drinks  or less a day.  Never drink and drive.  Dentist- Brush and floss teeth twice daily; visit your dentis twice a year.  Depression-Your emotional health is as important as your physical health.  If you're feeling down, losing interest in things you normally enjoy please talk with your healthcare provider.  Gun Safety- If you keep a gun in your home, keep it unloaded and with the safety lock on.  Bullets should be stored separately.  Helmet use- Always wear a helmet when riding a motorcycle, bicycle, rollerblading or skateboarding.  Safe sex- If you may be exposed to a sexually transmitted infection, use a condom  Seat belts- Seat bels can save your life; always wear one.  Smoke/Carbon Monoxide detectors- These detectors need to be installed on the appropriate level of your home.  Replace batteries at least once a year.  Skin Cancer- When out in the sun, cover up and use sunscreen SPF 15 or higher.  Violence- If anyone is threatening or hurting you, please tell your healthcare provider.

## 2018-03-22 NOTE — Progress Notes (Signed)
Patient ID: Jose Lyons, male   DOB: Apr 12, 1991, 27 y.o.   MRN: [id number]   Location:  [facility]  Place of Service:  OFFICE  Provider: Elmon Kirschner, DO  Patient Care Team: Sharon Seller, NP as PCP - General (Geriatric Medicine)  Extended Emergency Contact Information Primary Emergency Contact: [facility] Address: [address]          [place], [place] [address] [place] Phone: [phone] Relation: Mother Secondary Emergency Contact: Harris,Parker Address: [address]          [place], [place] [address] [place] Phone: [phone] Relation: Friend  Code Status: FULL CODE Goals of Care: Advanced Directive information Advanced Directives 03/17/2017  Does Patient Have a Medical Advance Directive? No  Would patient like information on creating a medical advance directive? No - Patient declined     Chief Complaint  Patient presents with  . Annual Exam    HPI: Patient is a 27 y.o. male seen in today for an annual wellness exam.  Lab results reviewed - HDL 32 (prev 48); weight down 3 lbs. FHx HTN, DM in parents. Not sure of anyone with low HDL. Toenail fungus resolved with lamisil.  He travels to [facility], [place] frequently to visit his girlfriend who is doing a pediatric ICU fellowship.  DIET - he makes mostly healthy food choices and avoids sweet drinks. He reduced red meat consumption. He consumes 2-4 cups of caffienated coffee daily (16 oz/serving)  EXERCISE - plays sports like Ultimate frisbee and weight lifting. He started Judo last year  DENTIST - usually has hygiene appt every 6 mos but missed his last one  SMOKING - NONE  SEATBELTS - wears consistently  Atopic dermatitis - occasional flare that resolves with topical triamcinolone cream   Depression screen [facility] 2/9 03/17/2017 03/12/2016 02/12/2015  Decreased Interest 0 0 0  Down, Depressed, Hopeless 0 0 0  PHQ - 2 Score 0 0 0    Fall Risk  03/17/2017 03/12/2016 02/26/2015    Falls in the past year? No No No   No flowsheet data found.   Health Maintenance  Topic Date Due  . INFLUENZA VACCINE  04/07/2018  . TETANUS/TDAP  03/12/2026  . HIV Screening  Completed    Past Medical History:  Diagnosis Date  . MRSA (methicillin resistant staph aureus) culture positive     No past surgical history on file.  Family History  Problem Relation Age of Onset  . Diabetes Paternal Grandmother    Family Status  Relation Name Status  . Mother  Alive  . Father  Alive  . PGM  Deceased    Social History   Socioeconomic History  . Marital status: Single    Spouse name: Not on file  . Number of children: Not on file  . Years of education: Not on file  . Highest education level: Not on file  Occupational History  . Not on file  Social Needs  . Financial resource strain: Not on file  . Food insecurity:    Worry: Not on file    Inability: Not on file  . Transportation needs:    Medical: Not on file    Non-medical: Not on file  Tobacco Use  . Smoking status: Never Smoker  . Smokeless tobacco: Never Used  Substance and Sexual Activity  . Alcohol use: No    Alcohol/week: 0.0 oz  . Drug use: No  . Sexual activity: Not on file  Lifestyle  . Physical activity:    Days per week: Not on file    Minutes per session: Not on file  . Stress: Not on file  Relationships  . Social connections:    Talks on phone: Not on file    Gets together: Not on file    Attends religious service: Not on file    Active member of club or organization: Not on file    Attends meetings of clubs or organizations: Not on file    Relationship status: Not on file  . Intimate partner violence:    Fear of current or ex partner: Not on file    Emotionally abused: Not on file    Physically abused: Not on file    Forced sexual activity: Not on file  Other Topics Concern  . Not on file  Social History Narrative   Diet:      Do you drink/ eat things with caffeine? coffee       Marital status:                               What year were you married ?      Do you live in a house, apartment,assistred living, condo, trailer, etc.)? apartment      Is it one or more stories? 2      How many persons live in your home ? 1      Do you have any pets in your home ?(please list) no      Current or past profession: Engineer       Do you exercise?  Yes                            Type & how often: 5 x a week      Do you have a living will?No      Do you have a DNR form?    No                   If not, do you want to discuss one? Yes      Do you have signed POA?HPOA forms?                 If so, please bring to your        appointment       No Known Allergies  Allergies as of 03/22/2018   No Known Allergies     Medication List        Accurate as of 03/22/18  9:56 AM. Always use your most recent med list.          cetirizine 10 MG chewable tablet Commonly known as:  ZYRTEC Chew 10 mg by mouth daily.   fexofenadine 180 MG tablet Commonly known as:  ALLEGRA Take 180 mg by mouth daily.   Fish Oil 1200 MG Caps Take by mouth. Take 1 tablet by mouth daily   triamcinolone cream 0.5 % Commonly known as:  KENALOG APP A THIN  LAYER AA AS DIRECTED        Review of Systems:  Review of Systems  Musculoskeletal: Positive for neck pain.  Skin: Positive for rash.  All other systems reviewed and are negative.   Physical Exam: Vitals:   03/22/18 0940  BP: 128/82  Pulse: 76  Resp: 18  Temp: 97.9 F (36.6 C)  TempSrc: Oral  SpO2: 97%  Weight: 152 lb 9.6 oz (69.2 kg)  Height: 5\' 8"  (1.727 m)   Body mass index is 23.2 kg/m. Physical Exam  Constitutional: He is oriented to person, place, and time. He appears well-developed and well-nourished. No distress.  HENT:  Head: Normocephalic and atraumatic.  Right Ear: Hearing, tympanic membrane, external ear and ear canal normal.  Left Ear: Hearing, tympanic membrane, external ear and ear canal normal.    Mouth/Throat: Uvula is midline, oropharynx is clear and moist and mucous membranes are normal. He does not have dentures.  Eyes: Pupils are equal, round, and reactive to light. Conjunctivae, EOM and lids are normal. No scleral icterus.  Neck: Trachea normal and normal range of motion. Neck supple. No muscular tenderness present. Carotid bruit is not present. No neck rigidity. No thyroid mass and no thyromegaly present.  Cardiovascular: Normal rate, regular rhythm, normal heart sounds and intact distal pulses. Exam reveals no gallop and no friction rub.  No murmur heard. No LE edema b/l. No calf TTP.   Pulmonary/Chest: Effort normal and breath sounds normal. He has no wheezes. He has no rhonchi. He has no rales. He exhibits no mass. Right breast exhibits no inverted nipple, no mass, no nipple discharge, no skin change and no tenderness. Left breast exhibits no inverted nipple, no mass, no nipple discharge, no skin change and no tenderness. Breasts are symmetrical.  Abdominal: Soft. Normal appearance, normal aorta and bowel sounds are normal. He exhibits no pulsatile midline mass and no mass. There is no hepatosplenomegaly. There is no tenderness. There is no rigidity, no rebound and no guarding. No hernia.  Musculoskeletal: Normal range of motion. He exhibits no deformity.  Lymphadenopathy:       Head (right side): No posterior auricular adenopathy present.       Head (left side): No posterior auricular adenopathy present.    He has no cervical adenopathy.       Right: No supraclavicular adenopathy present.       Left: No supraclavicular adenopathy present.  Neurological: He is alert and oriented to person, place, and time. He has normal strength and normal reflexes. No cranial nerve deficit. Gait normal.  Skin: Skin is warm, dry and intact. No rash noted. Nails show no clubbing.  Multiple nevi - no change in shape, bleeding, color or ulceration  Psychiatric: He has a normal mood and affect. His  speech is normal and behavior is normal. Judgment and thought content normal. Cognition and memory are normal.    Labs reviewed:  Basic Metabolic Panel: Recent Labs    03/18/18 0805  NA 140  K 4.2  CL 106  CO2 28  GLUCOSE 90  BUN 17  CREATININE 1.01  CALCIUM 9.1   Liver Function Tests: Recent Labs    03/18/18 0805  AST 24  ALT 32  BILITOT 0.4  PROT 7.0   No results for input(s): LIPASE, AMYLASE in the last 8760 hours. No results for input(s): AMMONIA in the last 8760 hours. CBC: Recent Labs    03/18/18 0805  WBC 5.0  NEUTROABS 2,655  HGB 14.5  HCT 43.1  MCV 84.0  PLT 251   Lipid Panel: Recent Labs    03/18/18 0805  CHOL 106  HDL 32*  LDLCALC 54  TRIG 161  CHOLHDL 3.3   No results found for: HGBA1C  Procedures: No results found.  Assessment/Plan   ICD-10-CM   1. Well adult exam Z00.00   2. Seasonal allergic rhinitis due to  pollen J30.1   3. Prehypertension R03.0   4. Low HDL (under 40) E78.6     Continue current medications as ordered  May use triamcinolone cream for insect bites  Follow up in 1 yr for CPE. Fasting labs prior to appt  Keeping you healthy handout given  Discussed he may need to try herbal supplement or even accupuncture to reduce BP along with lifestyle modification    Cassey Bacigalupo S. Ancil Linsey  [facility] [address] [place], [place] [address] [phone] Cell (Monday-Friday 8 AM - 5 PM) [phone] After 5 PM and follow prompts

## 2018-07-19 ENCOUNTER — Ambulatory Visit (INDEPENDENT_AMBULATORY_CARE_PROVIDER_SITE_OTHER): Payer: BLUE CROSS/BLUE SHIELD

## 2018-07-19 DIAGNOSIS — Z23 Encounter for immunization: Secondary | ICD-10-CM | POA: Diagnosis not present

## 2019-05-19 ENCOUNTER — Other Ambulatory Visit: Payer: Self-pay

## 2019-05-19 ENCOUNTER — Ambulatory Visit (INDEPENDENT_AMBULATORY_CARE_PROVIDER_SITE_OTHER): Payer: BC Managed Care – PPO

## 2019-05-19 DIAGNOSIS — Z23 Encounter for immunization: Secondary | ICD-10-CM

## 2019-10-13 ENCOUNTER — Ambulatory Visit: Payer: BC Managed Care – PPO | Attending: Internal Medicine

## 2019-10-13 DIAGNOSIS — Z20822 Contact with and (suspected) exposure to covid-19: Secondary | ICD-10-CM | POA: Insufficient documentation

## 2019-10-14 LAB — NOVEL CORONAVIRUS, NAA: SARS-CoV-2, NAA: NOT DETECTED

## 2019-12-01 ENCOUNTER — Ambulatory Visit
Admission: RE | Admit: 2019-12-01 | Discharge: 2019-12-01 | Disposition: A | Discharge status: Home / Self Care | Payer: BC Managed Care – PPO | Source: Ambulatory Visit | Attending: Nurse Practitioner | Admitting: Nurse Practitioner

## 2019-12-01 ENCOUNTER — Other Ambulatory Visit: Payer: Self-pay

## 2019-12-01 ENCOUNTER — Ambulatory Visit (INDEPENDENT_AMBULATORY_CARE_PROVIDER_SITE_OTHER): Payer: BC Managed Care – PPO | Admitting: Nurse Practitioner

## 2019-12-01 ENCOUNTER — Encounter: Payer: Self-pay | Admitting: Nurse Practitioner

## 2019-12-01 VITALS — BP 140/100 | HR 63 | Temp 97.7°F | Resp 16 | Ht 68.0 in | Wt 160.0 lb

## 2019-12-01 DIAGNOSIS — Z13220 Encounter for screening for lipoid disorders: Secondary | ICD-10-CM

## 2019-12-01 DIAGNOSIS — S6991XA Unspecified injury of right wrist, hand and finger(s), initial encounter: Secondary | ICD-10-CM | POA: Diagnosis not present

## 2019-12-01 DIAGNOSIS — R030 Elevated blood-pressure reading, without diagnosis of hypertension: Secondary | ICD-10-CM

## 2019-12-01 DIAGNOSIS — M25531 Pain in right wrist: Secondary | ICD-10-CM
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
## 2019-12-01 NOTE — Patient Instructions (Addendum)
Can use ibuprofen 600 mg every 6 hours as needed pain- take with food.   To get xray at [facility]    GOAL BP should be LESS than 140/90   DASH Eating Plan DASH stands for "Dietary Approaches to Stop Hypertension." The DASH eating plan is a healthy eating plan that has been shown to reduce high blood pressure (hypertension). It may also reduce your risk for type 2 diabetes, heart disease, and stroke. The DASH eating plan may also help with weight loss. What are tips for following this plan?  General guidelines  Avoid eating more than 2,300 mg (milligrams) of salt (sodium) a day. If you have hypertension, you may need to reduce your sodium intake to 1,500 mg a day.  Limit alcohol intake to no more than 1 drink a day for nonpregnant women and 2 drinks a day for men. One drink equals 12 oz of beer, 5 oz of wine, or 1 oz of hard liquor.  Work with your health care provider to maintain a healthy body weight or to lose weight. Ask what an ideal weight is for you.  Get at least 30 minutes of exercise that causes your heart to beat faster (aerobic exercise) most days of the week. Activities may include walking, swimming, or biking.  Work with your health care provider or diet and nutrition specialist (dietitian) to adjust your eating plan to your individual calorie needs. Reading food labels   Check food labels for the amount of sodium per serving. Choose foods with less than 5 percent of the Daily Value of sodium. Generally, foods with less than 300 mg of sodium per serving fit into this eating plan.  To find whole grains, look for the word "whole" as the first word in the ingredient list. Shopping  Buy products labeled as "low-sodium" or "no salt added."  Buy fresh foods. Avoid canned foods and premade or frozen meals. Cooking  Avoid adding salt when cooking. Use salt-free seasonings or herbs instead of table salt or sea salt. Check with your health care provider or  pharmacist before using salt substitutes.  Do not fry foods. Cook foods using healthy methods such as baking, boiling, grilling, and broiling instead.  Cook with heart-healthy oils, such as olive, canola, soybean, or sunflower oil. Meal planning  Eat a balanced diet that includes: ? 5 or more servings of fruits and vegetables each day. At each meal, try to fill half of your plate with fruits and vegetables. ? Up to 6-8 servings of whole grains each day. ? Less than 6 oz of lean meat, poultry, or fish each day. A 3-oz serving of meat is about the same size as a deck of cards. One egg equals 1 oz. ? 2 servings of low-fat dairy each day. ? A serving of nuts, seeds, or beans 5 times each week. ? Heart-healthy fats. Healthy fats called Omega-3 fatty acids are found in foods such as flaxseeds and coldwater fish, like sardines, salmon, and mackerel.  Limit how much you eat of the following: ? Canned or prepackaged foods. ? Food that is high in trans fat, such as fried foods. ? Food that is high in saturated fat, such as fatty meat. ? Sweets, desserts, sugary drinks, and other foods with added sugar. ? Full-fat dairy products.  Do not salt foods before eating.  Try to eat at least 2 vegetarian meals each week.  Eat more home-cooked food and less restaurant, buffet, and fast food.  When eating at  a restaurant, ask that your food be prepared with less salt or no salt, if possible. What foods are recommended? The items listed may not be a complete list. Talk with your dietitian about what dietary choices are best for you. Grains Whole-grain or whole-wheat bread. Whole-grain or whole-wheat pasta. Brown rice. Modena Morrow. Bulgur. Whole-grain and low-sodium cereals. Pita bread. Low-fat, low-sodium crackers. Whole-wheat flour tortillas. Vegetables Fresh or frozen vegetables (raw, steamed, roasted, or grilled). Low-sodium or reduced-sodium tomato and vegetable juice. Low-sodium or  reduced-sodium tomato sauce and tomato paste. Low-sodium or reduced-sodium canned vegetables. Fruits All fresh, dried, or frozen fruit. Canned fruit in natural juice (without added sugar). Meat and other protein foods Skinless chicken or [place]. Ground chicken or [place]. Pork with fat trimmed off. Fish and seafood. Egg whites. Dried beans, peas, or lentils. Unsalted nuts, nut butters, and seeds. Unsalted canned beans. Lean cuts of beef with fat trimmed off. Low-sodium, lean deli meat. Dairy Low-fat (1%) or fat-free (skim) milk. Fat-free, low-fat, or reduced-fat cheeses. Nonfat, low-sodium ricotta or cottage cheese. Low-fat or nonfat yogurt. Low-fat, low-sodium cheese. Fats and oils Soft margarine without trans fats. Vegetable oil. Low-fat, reduced-fat, or light mayonnaise and salad dressings (reduced-sodium). Canola, safflower, olive, soybean, and sunflower oils. Avocado. Seasoning and other foods Herbs. Spices. Seasoning mixes without salt. Unsalted popcorn and pretzels. Fat-free sweets. What foods are not recommended? The items listed may not be a complete list. Talk with your dietitian about what dietary choices are best for you. Grains Baked goods made with fat, such as croissants, muffins, or some breads. Dry pasta or rice meal packs. Vegetables Creamed or fried vegetables. Vegetables in a cheese sauce. Regular canned vegetables (not low-sodium or reduced-sodium). Regular canned tomato sauce and paste (not low-sodium or reduced-sodium). Regular tomato and vegetable juice (not low-sodium or reduced-sodium). Angie Fava. Olives. Fruits Canned fruit in a light or heavy syrup. Fried fruit. Fruit in cream or butter sauce. Meat and other protein foods Fatty cuts of meat. Ribs. Fried meat. Berniece Salines. Sausage. Bologna and other processed lunch meats. Salami. Fatback. Hotdogs. Bratwurst. Salted nuts and seeds. Canned beans with added salt. Canned or smoked fish. Whole eggs or egg yolks. Chicken or [place]  with skin. Dairy Whole or 2% milk, cream, and half-and-half. Whole or full-fat cream cheese. Whole-fat or sweetened yogurt. Full-fat cheese. Nondairy creamers. Whipped toppings. Processed cheese and cheese spreads. Fats and oils Butter. Stick margarine. Lard. Shortening. Ghee. Bacon fat. Tropical oils, such as coconut, palm kernel, or palm oil. Seasoning and other foods Salted popcorn and pretzels. Onion salt, garlic salt, seasoned salt, table salt, and sea salt. Worcestershire sauce. Tartar sauce. Barbecue sauce. Teriyaki sauce. Soy sauce, including reduced-sodium. Steak sauce. Canned and packaged gravies. Fish sauce. Oyster sauce. Cocktail sauce. Horseradish that you find on the shelf. Ketchup. Mustard. Meat flavorings and tenderizers. Bouillon cubes. Hot sauce and Tabasco sauce. Premade or packaged marinades. Premade or packaged taco seasonings. Relishes. Regular salad dressings. Where to find more information:  National Heart, Lung, and Joanna: https://wilson-eaton.com/  American Heart Association: www.heart.org Summary  The DASH eating plan is a healthy eating plan that has been shown to reduce high blood pressure (hypertension). It may also reduce your risk for type 2 diabetes, heart disease, and stroke.  With the DASH eating plan, you should limit salt (sodium) intake to 2,300 mg a day. If you have hypertension, you may need to reduce your sodium intake to 1,500 mg a day.  When on the DASH eating plan, aim to eat  more fresh fruits and vegetables, whole grains, lean proteins, low-fat dairy, and heart-healthy fats.  Work with your health care provider or diet and nutrition specialist (dietitian) to adjust your eating plan to your individual calorie needs. This information is not intended to replace advice given to you by your health care provider. Make sure you discuss any questions you have with your health care provider. Document Revised: 08/06/2017 Document Reviewed:  08/17/2016 Elsevier Patient Education  2020 [employer].

## 2019-12-02 LAB — COMPLETE METABOLIC PANEL WITH GFR
AG Ratio: 1.6 (calc) (ref 1.0–2.5)
ALT: 25 U/L (ref 9–46)
AST: 21 U/L (ref 10–40)
Albumin: 4.9 g/dL (ref 3.6–5.1)
Alkaline phosphatase (APISO): 69 U/L (ref 36–130)
BUN: 17 mg/dL (ref 7–25)
CO2: 25 mmol/L (ref 20–32)
Calcium: 10.1 mg/dL (ref 8.6–10.3)
Chloride: 101 mmol/L (ref 98–110)
Creat: 1.05 mg/dL (ref 0.60–1.35)
GFR, Est African American: 110 mL/min/{1.73_m2} (ref >=60)
GFR, Est Non African American: 95 mL/min/{1.73_m2} (ref >=60)
Globulin: 3 g/dL (calc) (ref 1.9–3.7)
Glucose, Bld: 87 mg/dL (ref 65–99)
Potassium: 4.2 mmol/L (ref 3.5–5.3)
Sodium: 139 mmol/L (ref 135–146)
Total Bilirubin: 0.6 mg/dL (ref 0.2–1.2)
Total Protein: 7.9 g/dL (ref 6.1–8.1)

## 2019-12-02 LAB — LIPID PANEL
Cholesterol: 195 mg/dL (ref <200)
HDL: 42 mg/dL (ref >=40)
LDL Cholesterol (Calc): 106 mg/dL (calc) — ABNORMAL HIGH
Non-HDL Cholesterol (Calc): 153 mg/dL (calc) — ABNORMAL HIGH (ref <130)
Total CHOL/HDL Ratio: 4.6 (calc) (ref <5.0)
Triglycerides: 349 mg/dL — ABNORMAL HIGH (ref <150)

## 2019-12-02 LAB — CBC WITH DIFFERENTIAL/PLATELET
Absolute Monocytes: 322 cells/uL (ref 200–950)
Basophils Absolute: 32 cells/uL (ref 0–200)
Basophils Relative: 0.7 %
Eosinophils Absolute: 143 cells/uL (ref 15–500)
Eosinophils Relative: 3.1 %
HCT: 49 % (ref 38.5–50.0)
Hemoglobin: 16 g/dL (ref 13.2–17.1)
Lymphs Abs: 1444 cells/uL (ref 850–3900)
MCH: 28.4 pg (ref 27.0–33.0)
MCHC: 32.7 g/dL (ref 32.0–36.0)
MCV: 86.9 fL (ref 80.0–100.0)
MPV: 10.5 fL (ref 7.5–12.5)
Monocytes Relative: 7 %
Neutro Abs: 2659 cells/uL (ref 1500–7800)
Neutrophils Relative %: 57.8 %
Platelets: 258 10*3/uL (ref 140–400)
RBC: 5.64 10*6/uL (ref 4.20–5.80)
RDW: 12.9 % (ref 11.0–15.0)
Total Lymphocyte: 31.4 %
WBC: 4.6 10*3/uL (ref 3.8–10.8)

## 2020-03-04 ENCOUNTER — Other Ambulatory Visit: Payer: Self-pay | Admitting: Nurse Practitioner

## 2020-03-04 ENCOUNTER — Other Ambulatory Visit: Payer: Self-pay

## 2020-03-04 ENCOUNTER — Other Ambulatory Visit: Payer: BC Managed Care – PPO

## 2020-03-04 DIAGNOSIS — E781 Pure hyperglyceridemia: Secondary | ICD-10-CM | POA: Diagnosis not present

## 2020-03-04 DIAGNOSIS — B351 Tinea unguium: Secondary | ICD-10-CM

## 2020-03-04 LAB — LIPID PANEL
Cholesterol: 151 mg/dL (ref <200)
HDL: 43 mg/dL (ref >=40)
LDL Cholesterol (Calc): 82 mg/dL (calc)
Non-HDL Cholesterol (Calc): 108 mg/dL (calc) (ref <130)
Total CHOL/HDL Ratio: 3.5 (calc) (ref <5.0)
Triglycerides: 165 mg/dL — ABNORMAL HIGH (ref <150)

## 2020-03-05 ENCOUNTER — Other Ambulatory Visit: Payer: Self-pay

## 2020-03-05 MED ORDER — TERBINAFINE HCL 250 MG PO TABS: 250.0000 mg | ORAL_TABLET | Freq: Every day | ORAL | 0 refills | Status: DC

## 2020-03-12 ENCOUNTER — Other Ambulatory Visit: Payer: Self-pay | Admitting: Nurse Practitioner

## 2020-03-12 MED ORDER — TERBINAFINE HCL 250 MG PO TABS: 250.0000 mg | ORAL_TABLET | Freq: Every day | ORAL | 0 refills | Status: AC

## 2020-03-12 NOTE — Telephone Encounter (Signed)
Medication is usually temporary, will send to Sharon Seller, NP for review and approval

## 2020-04-15 ENCOUNTER — Other Ambulatory Visit: Payer: Self-pay

## 2020-04-16 ENCOUNTER — Other Ambulatory Visit: Payer: Self-pay | Admitting: Nurse Practitioner

## 2020-04-16 DIAGNOSIS — B351 Tinea unguium: Secondary | ICD-10-CM | POA: Diagnosis not present

## 2020-04-17 LAB — HEPATIC FUNCTION PANEL
AG Ratio: 1.7 (calc) (ref 1.0–2.5)
ALT: 21 U/L (ref 9–46)
AST: 22 U/L (ref 10–40)
Albumin: 4.7 g/dL (ref 3.6–5.1)
Alkaline phosphatase (APISO): 60 U/L (ref 36–130)
Bilirubin, Direct: 0.1 mg/dL (ref 0.0–0.2)
Globulin: 2.8 g/dL (calc) (ref 1.9–3.7)
Indirect Bilirubin: 0.7 mg/dL (calc) (ref 0.2–1.2)
Total Bilirubin: 0.8 mg/dL (ref 0.2–1.2)
Total Protein: 7.5 g/dL (ref 6.1–8.1)

## 2020-04-19 ENCOUNTER — Other Ambulatory Visit: Payer: Self-pay

## 2020-04-19 ENCOUNTER — Ambulatory Visit (INDEPENDENT_AMBULATORY_CARE_PROVIDER_SITE_OTHER): Payer: BC Managed Care – PPO | Admitting: Nurse Practitioner

## 2020-04-19 ENCOUNTER — Encounter: Payer: Self-pay | Admitting: Nurse Practitioner

## 2020-04-19 VITALS — BP 132/84 | HR 91 | Temp 97.1°F | Ht 68.0 in | Wt 156.0 lb

## 2020-04-19 DIAGNOSIS — Z0000 Encounter for general adult medical examination without abnormal findings: Secondary | ICD-10-CM

## 2020-04-19 NOTE — Patient Instructions (Signed)

## 2020-04-19 NOTE — Progress Notes (Signed)
Provider: Lauree Chandler, NP  Patient Care Team: Lauree Chandler, NP as PCP - General (Geriatric Medicine)  Extended Emergency Contact Information Primary Emergency Contact: [facility] Address: [place] [address] Johnnette Litter of [place] Phone: [phone] Relation: Mother Secondary Emergency Contact: Snavely,Eric Address: [address]          [place], [place] [address] Johnnette Litter of [place] Phone: [phone] Relation: Friend No Known Allergies Code Status: FULL Goals of Care: Advanced Directive information Advanced Directives 12/01/2019  Does Patient Have a Medical Advance Directive? No  Would patient like information on creating a medical advance directive? -     Chief Complaint  Patient presents with   Annual Exam    Yearly physical    Best Practice Recommendations    Discuss need for Hep C screening    Immunizations    Flu vaccine not in stock     HPI: Patient is a 29 y.o. male seen in today for an wellness exam at [place]? Attempts healthy diet  Exercise? 30 mins 5 days a week at least    Dentition:every 6 months  Ophthalmology appt: not needed   Routine specialist: none.    Depression screen [facility] 2/9 04/19/2020 03/17/2017 03/12/2016 02/12/2015  Decreased Interest 0 0 0 0  Down, Depressed, Hopeless 0 0 0 0  PHQ - 2 Score 0 0 0 0    Fall Risk  04/19/2020 12/01/2019 03/17/2017 03/12/2016 02/26/2015  Falls in the past year? 1 0 No No No  Number falls in past yr: 1 0 - - -  Injury with Fall? 0 0 - - -  Risk for fall due to : History of fall(s) - - - -   No flowsheet data found.   Health Maintenance  Topic Date Due   Hepatitis C Screening  Never done   INFLUENZA VACCINE  04/07/2020   TETANUS/TDAP  03/12/2026   COVID-19 Vaccine  Completed   HIV Screening  Completed    Past Medical History:  Diagnosis Date   MRSA (methicillin resistant staph aureus) culture positive     History reviewed. No pertinent  surgical history.  Social History   Socioeconomic History   Marital status: Single    Spouse name: Not on file   Number of children: Not on file   Years of education: Not on file   Highest education level: Not on file  Occupational History   Not on file  Tobacco Use   Smoking status: Never Smoker   Smokeless tobacco: Never Used  Substance and Sexual Activity   Alcohol use: No    Alcohol/week: 0.0 standard drinks   Drug use: No   Sexual activity: Not on file  Other Topics Concern   Not on file  Social History Narrative   Diet:      Do you drink/ eat things with caffeine? coffee      Marital status:                               What year were you married ?      Do you live in a house, apartment,assistred living, condo, trailer, etc.)? apartment      Is it one or more stories? 2      How many persons live in your home ? 1      Do you  have any pets in your home ?(please list) no      Current or past profession: Engineer       Do you exercise?  Yes                            Type & how often: 5 x a week      Do you have a living will?No      Do you have a DNR form?    No                   If not, do you want to discuss one? Yes      Do you have signed POA?HPOA forms?                 If so, please bring to your        appointment      Social Determinants of Health   Financial Resource Strain:    Difficulty of Paying Living Expenses:   Food Insecurity:    Worried About [place] in the Last Year:    Arboriculturist in the Last Year:   Transportation Needs:    Film/video editor (Medical):    Lack of Transportation (Non-Medical):   Physical Activity:    Days of Exercise per Week:    Minutes of Exercise per Session:   Stress:    Feeling of Stress :   Social Connections:    Frequency of Communication with Friends and Family:    Frequency of Social Gatherings with Friends and Family:    Attends Religious Services:     Active Member of Clubs or Organizations:    Attends Music therapist:    Marital Status:     Family History  Problem Relation Age of Onset   Diabetes Mother    Hypertension Mother    Diabetes Father    Hypertension Father    Diabetes Paternal Grandmother     Review of Systems:  Review of Systems  Constitutional: Negative for chills, fever and weight loss.  HENT: Negative for tinnitus.   Respiratory: Negative for cough, sputum production and shortness of breath.   Cardiovascular: Negative for chest pain, palpitations and leg swelling.  Gastrointestinal: Negative for abdominal pain, constipation, diarrhea and heartburn.  Genitourinary: Negative for dysuria, frequency and urgency.  Musculoskeletal: Negative for back pain, falls, joint pain and myalgias.  Skin: Negative.   Neurological: Negative for dizziness and headaches.  Psychiatric/Behavioral: Negative for depression and memory loss. The patient does not have insomnia.      Allergies as of 04/19/2020   No Known Allergies     Medication List       Accurate as of April 19, 2020 10:19 AM. If you have any questions, ask your nurse or doctor.        cetirizine 10 MG chewable tablet Commonly known as: ZYRTEC Chew 10 mg by mouth as directed. Usually switches between Zyrtec and Allegra   fexofenadine 180 MG tablet Commonly known as: ALLEGRA Take 180 mg by mouth daily.   Fish Oil 1200 MG Caps Take by mouth. Take 1 tablet by mouth daily   terbinafine 250 MG tablet Commonly known as: LAMISIL Take 1 tablet (250 mg total) by mouth daily. Take for 12 weeks then stop   triamcinolone cream 0.5 % Commonly known as: KENALOG as needed.  Physical Exam: Vitals:   04/19/20 1008  BP: 132/84  Pulse: 91  Temp: (!) 97.1 F (36.2 C)  TempSrc: Temporal  SpO2: 96%  Weight: 156 lb (70.8 kg)  Height: '5\' 8"'$  (1.727 m)   Body mass index is 23.72 kg/m. Wt Readings from Last 3 Encounters:    04/19/20 156 lb (70.8 kg)  12/01/19 160 lb (72.6 kg)  03/22/18 152 lb 9.6 oz (69.2 kg)    Physical Exam Constitutional:      General: He is not in acute distress.    Appearance: He is well-developed. He is not diaphoretic.  HENT:     Head: Normocephalic and atraumatic.     Right Ear: Tympanic membrane, ear canal and external ear normal. There is no impacted cerumen.     Left Ear: Tympanic membrane, ear canal and external ear normal. There is no impacted cerumen.     Mouth/Throat:     Pharynx: No oropharyngeal exudate.  Eyes:     Conjunctiva/sclera: Conjunctivae normal.     Pupils: Pupils are equal, round, and reactive to light.  Cardiovascular:     Rate and Rhythm: Normal rate and regular rhythm.     Heart sounds: Normal heart sounds.  Pulmonary:     Effort: Pulmonary effort is normal.     Breath sounds: Normal breath sounds.  Abdominal:     General: Bowel sounds are normal.     Palpations: Abdomen is soft.  Musculoskeletal:        General: No tenderness.     Cervical back: Normal range of motion and neck supple.  Skin:    General: Skin is warm and dry.  Neurological:     Mental Status: He is alert and oriented to person, place, and time.  Psychiatric:        Mood and Affect: Mood normal.        Behavior: Behavior normal.     Labs reviewed: Basic Metabolic Panel: Recent Labs    12/01/19 1015  NA 139  K 4.2  CL 101  CO2 25  GLUCOSE 87  BUN 17  CREATININE 1.05  CALCIUM 10.1   Liver Function Tests: Recent Labs    12/01/19 1015 04/16/20 0933  AST 21 22  ALT 25 21  BILITOT 0.6 0.8  PROT 7.9 7.5   No results for input(s): LIPASE, AMYLASE in the last 8760 hours. No results for input(s): AMMONIA in the last 8760 hours. CBC: Recent Labs    12/01/19 1015  WBC 4.6  NEUTROABS 2,659  HGB 16.0  HCT 49.0  MCV 86.9  PLT 258   Lipid Panel: Recent Labs    12/01/19 1015 03/04/20 1017  CHOL 195 151  HDL 42 43  LDLCALC 106* 82  TRIG 349* 165*   CHOLHDL 4.6 3.5   No results found for: HGBA1C  Procedures: No results found.  Assessment/Plan 1. Preventative health care -completed today -The patient was counseled regarding the appropriate use of alcohol, prevention of dental and periodontal disease, diet, regular sustained exercise for at least 30 minutes 5 times per week, testicular self-examination on a monthly basis,smoking cessation, tobacco use,  and recommended schedule for GI hemoccult testing, colonoscopy, cholesterol, thyroid and diabetes screening. - CMP with eGFR([facility]); Future - CBC with Differential/Platelet; Future - Hepatitis C antibody; Future - Lipid Panel; Future   Next appt: year with labs prior  Taelynn Mcelhannon K. [place], [facility] [phone]

## 2020-07-02 DIAGNOSIS — Z0000 Encounter for general adult medical examination without abnormal findings: Secondary | ICD-10-CM | POA: Diagnosis not present

## 2020-07-30 DIAGNOSIS — Z20822 Contact with and (suspected) exposure to covid-19: Secondary | ICD-10-CM | POA: Diagnosis not present

## 2020-09-02 DIAGNOSIS — K5730 Diverticulosis of large intestine without perforation or abscess without bleeding: Secondary | ICD-10-CM | POA: Diagnosis not present

## 2020-09-02 DIAGNOSIS — Z1211 Encounter for screening for malignant neoplasm of colon: Secondary | ICD-10-CM | POA: Diagnosis not present

## 2020-09-02 DIAGNOSIS — D123 Benign neoplasm of transverse colon: Secondary | ICD-10-CM | POA: Diagnosis not present

## 2020-09-02 DIAGNOSIS — K640 First degree hemorrhoids: Secondary | ICD-10-CM | POA: Diagnosis not present

## 2020-09-02 DIAGNOSIS — K635 Polyp of colon: Secondary | ICD-10-CM | POA: Diagnosis not present
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
## 2020-11-27 DIAGNOSIS — Z20822 Contact with and (suspected) exposure to covid-19: Secondary | ICD-10-CM | POA: Diagnosis not present

## 2020-11-27 DIAGNOSIS — U071 COVID-19: Secondary | ICD-10-CM | POA: Diagnosis not present

## 2021-04-21 ENCOUNTER — Other Ambulatory Visit: Payer: BC Managed Care – PPO

## 2021-04-25 ENCOUNTER — Ambulatory Visit: Payer: BC Managed Care – PPO | Admitting: Nurse Practitioner

## 2021-06-30 DIAGNOSIS — M25511 Pain in right shoulder: Secondary | ICD-10-CM | POA: Diagnosis not present

## 2021-06-30 DIAGNOSIS — M19011 Primary osteoarthritis, right shoulder: Secondary | ICD-10-CM | POA: Diagnosis not present

## 2021-07-09 DIAGNOSIS — Z0000 Encounter for general adult medical examination without abnormal findings: Secondary | ICD-10-CM | POA: Diagnosis not present

## 2021-07-09 DIAGNOSIS — Z1159 Encounter for screening for other viral diseases: Secondary | ICD-10-CM | POA: Diagnosis not present

## 2022-01-11 DIAGNOSIS — J029 Acute pharyngitis, unspecified: Secondary | ICD-10-CM | POA: Diagnosis not present

## 2022-01-11 DIAGNOSIS — R509 Fever, unspecified: Secondary | ICD-10-CM | POA: Diagnosis not present

## 2022-07-10 DIAGNOSIS — Z125 Encounter for screening for malignant neoplasm of prostate: Secondary | ICD-10-CM | POA: Diagnosis not present

## 2022-07-10 DIAGNOSIS — Z136 Encounter for screening for cardiovascular disorders: Secondary | ICD-10-CM | POA: Diagnosis not present

## 2022-07-10 DIAGNOSIS — Z0000 Encounter for general adult medical examination without abnormal findings: Secondary | ICD-10-CM | POA: Diagnosis not present
